# Patient Record
Sex: Male | Born: 1960 | Race: White | Hispanic: No | Marital: Married | Smoking: Never smoker
Health system: Southern US, Community
[De-identification: ages and names within clinical notes are randomized; demographics above are authoritative.]

## PROBLEM LIST (undated history)

## (undated) DIAGNOSIS — J45909 Unspecified asthma, uncomplicated: Secondary | ICD-10-CM

## (undated) DIAGNOSIS — M1990 Unspecified osteoarthritis, unspecified site: Secondary | ICD-10-CM

## (undated) DIAGNOSIS — E119 Type 2 diabetes mellitus without complications: Secondary | ICD-10-CM

## (undated) DIAGNOSIS — I10 Essential (primary) hypertension: Secondary | ICD-10-CM

## (undated) DIAGNOSIS — K219 Gastro-esophageal reflux disease without esophagitis: Secondary | ICD-10-CM

## (undated) DIAGNOSIS — E669 Obesity, unspecified: Secondary | ICD-10-CM

## (undated) HISTORY — DX: Essential (primary) hypertension: I10

## (undated) HISTORY — PX: COLONOSCOPY: SHX174

## (undated) HISTORY — PX: TONSILLECTOMY: SUR1361

## (undated) HISTORY — DX: Obesity, unspecified: E66.9

## (undated) HISTORY — DX: Type 2 diabetes mellitus without complications: E11.9

---

## 2002-06-22 ENCOUNTER — Encounter: Payer: Self-pay | Admitting: Emergency Medicine

## 2002-06-22 ENCOUNTER — Emergency Department (HOSPITAL_COMMUNITY): Admission: EM | Admit: 2002-06-22 | Discharge: 2002-06-22 | Payer: Self-pay | Admitting: Emergency Medicine

## 2008-02-16 ENCOUNTER — Ambulatory Visit: Payer: Self-pay | Admitting: Vascular Surgery

## 2008-02-16 ENCOUNTER — Ambulatory Visit: Admission: RE | Admit: 2008-02-16 | Discharge: 2008-02-16 | Payer: Self-pay | Admitting: Family Medicine

## 2008-02-16 ENCOUNTER — Encounter (INDEPENDENT_AMBULATORY_CARE_PROVIDER_SITE_OTHER): Payer: Self-pay | Admitting: Family Medicine

## 2010-11-01 NOTE — Op Note (Signed)
NAME:  NEO, YEPIZ NO.:  [id number]   MEDICAL RECORD NO.:  [id number]                   PATIENT TYPE:  EMS   LOCATION:  MINO                                 FACILITY:  [facility]   PHYSICIAN:  Leonides Grills, M.D.                  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   POSTOPERATIVE DIAGNOSIS:  Right closed superior extensor retinaculum (SER)  IV lateral malleolus fracture.   OPERATION PERFORMED:  Open reduction internal fixation of right lateral  malleolus fracture.   SURGEON:  Leonides Grills, M.D.   ANESTHESIA:  General endotracheal tube.   ASSISTANT:  Lianne Cure, P.A.   ESTIMATED BLOOD LOSS:  Minimal.   TOURNIQUET TIME:  Approximately half hour.   COMPLICATIONS:  None.   DISPOSITION:  Stable to PR.   INDICATIONS FOR PROCEDURE:  The patient is a 50 year old male who slipped  and fell and sustained the above injury.  She has consented for the above  procedure.  All risks which include infection, neurovascular injury,  nonunion, malunion, hardware irritation, hardware failure, arthritis,  stiffness, were all explained, questions encouraged and answered.   DESCRIPTION OF PROCEDURE:  The patient was brought to the operating room and  placed in supine position.  After adequate general endotracheal anesthesia  was administered as well as Ancef 1 gm IV piggyback, the right lower  extremity was prepped and draped in sterile manner with a proximally placed  thigh tourniquet.  The limb was gravity exsanguinated and the tourniquet was  elevated to 290 mmHg.  A longitudinal incision over the lateral malleolus  was then made down to bone.  The fracture site was identified. Soft tissue  was debrided from the fracture site.  A 4-hole  one-third tubular plate was  then applied in an antiglide manner with a two-point reduction clamp in  inversion, internal rotation, the fracture site was reduced.  Antiglide  screw was then placed.  Proximal screw hole was then placed,  both were with  3.5 mm fully threaded cortical screws.  A lag screw was then placed across  the fracture site and one distal set screw was then placed, both were 3.5 mm  fully threaded cortical screws.  Final x-rays were obtained in the mortise  and lateral views and showed anatomic reduction and maintenance of a the  reduction as  well.  The wound was copiously irrigated with normal saline.  The tourniquet  was deflated, hemostasis was obtained.  Subcutaneous was closed with 2-0  Vicryl, skin was closed with 4-0 nylon.  Sterile dressing was applied.  Jones dressing was applied.  The patient stable to the PR.  Leonides Grills, M.D.    PB/MEDQ  D:  06/28/2002  T:  06/28/2002  Job:  [id number]

## 2012-06-16 HISTORY — PX: EYE SURGERY: SHX253

## 2012-10-29 ENCOUNTER — Other Ambulatory Visit (HOSPITAL_BASED_OUTPATIENT_CLINIC_OR_DEPARTMENT_OTHER): Payer: Self-pay | Admitting: Family Medicine

## 2012-10-29 DIAGNOSIS — IMO0002 Reserved for concepts with insufficient information to code with codable children: Secondary | ICD-10-CM

## 2012-10-30 ENCOUNTER — Ambulatory Visit (HOSPITAL_BASED_OUTPATIENT_CLINIC_OR_DEPARTMENT_OTHER)
Admission: RE | Admit: 2012-10-30 | Discharge: 2012-10-30 | Disposition: A | Discharge status: Home / Self Care | Payer: 59 | Source: Ambulatory Visit | Attending: Family Medicine | Admitting: Family Medicine

## 2012-10-30 DIAGNOSIS — M79609 Pain in unspecified limb: Secondary | ICD-10-CM | POA: Insufficient documentation

## 2012-10-30 DIAGNOSIS — M5126 Other intervertebral disc displacement, lumbar region: Secondary | ICD-10-CM | POA: Insufficient documentation

## 2012-10-30 DIAGNOSIS — M545 Low back pain, unspecified: Secondary | ICD-10-CM | POA: Insufficient documentation

## 2012-10-30 DIAGNOSIS — M5137 Other intervertebral disc degeneration, lumbosacral region: Secondary | ICD-10-CM | POA: Insufficient documentation

## 2012-10-30 DIAGNOSIS — Q762 Congenital spondylolisthesis: Secondary | ICD-10-CM | POA: Insufficient documentation

## 2012-10-30 DIAGNOSIS — M47817 Spondylosis without myelopathy or radiculopathy, lumbosacral region: Secondary | ICD-10-CM | POA: Insufficient documentation

## 2012-10-30 DIAGNOSIS — IMO0002 Reserved for concepts with insufficient information to code with codable children: Secondary | ICD-10-CM

## 2012-10-30 DIAGNOSIS — R29898 Other symptoms and signs involving the musculoskeletal system: Secondary | ICD-10-CM | POA: Insufficient documentation

## 2012-10-30 DIAGNOSIS — M51379 Other intervertebral disc degeneration, lumbosacral region without mention of lumbar back pain or lower extremity pain: Secondary | ICD-10-CM | POA: Insufficient documentation

## 2012-10-30 DIAGNOSIS — M48061 Spinal stenosis, lumbar region without neurogenic claudication: Secondary | ICD-10-CM | POA: Insufficient documentation

## 2014-02-09 ENCOUNTER — Encounter: Payer: 59 | Attending: Family Medicine

## 2014-02-09 VITALS — Ht 68.0 in | Wt 278.2 lb

## 2014-02-09 DIAGNOSIS — Z713 Dietary counseling and surveillance: Secondary | ICD-10-CM | POA: Insufficient documentation

## 2014-02-09 DIAGNOSIS — E119 Type 2 diabetes mellitus without complications: Secondary | ICD-10-CM | POA: Diagnosis present

## 2014-02-09 NOTE — Progress Notes (Signed)
Patient was seen on 02/09/2014 for the first of a series of three diabetes self-management courses at the [facility].  Patient Education Plan per assessed needs and concerns is to attend four course education program for Diabetes Self Management Education.  Current HbA1c: 9.2%  The following learning objectives were met by the patient during this class:  Describe diabetes  State some common risk factors for diabetes  Defines the role of glucose and insulin  Identifies type of diabetes and pathophysiology  Describe the relationship between diabetes and cardiovascular risk  State the members of the Healthcare Team  States the rationale for glucose monitoring  State when to test glucose  State their individual Target Range  State the importance of logging glucose readings  Describe how to interpret glucose readings  Identifies A1C target  Explain the correlation between A1c and eAG values  State symptoms and treatment of high blood glucose  State symptoms and treatment of low blood glucose  Explain proper technique for glucose testing  Identifies proper sharps disposal  Handouts given during class include:  Living Well with Diabetes book  Carb Counting and Meal Planning book  Meal Plan Card  Carbohydrate guide  Meal planning worksheet  Low Sodium Flavoring Tips  The diabetes portion plate  K2X to eAG Conversion Chart  Diabetes Medications  Diabetes Recommended Care Schedule  Support Group  Diabetes Success Plan  Core Class Satisfaction Survey  Follow-Up Plan:  Attend core 2

## 2014-02-16 ENCOUNTER — Encounter: Payer: 59 | Attending: Family Medicine

## 2014-02-16 DIAGNOSIS — E119 Type 2 diabetes mellitus without complications: Secondary | ICD-10-CM | POA: Diagnosis present

## 2014-02-16 DIAGNOSIS — Z713 Dietary counseling and surveillance: Secondary | ICD-10-CM | POA: Insufficient documentation

## 2014-02-17 NOTE — Progress Notes (Signed)

## 2014-02-23 DIAGNOSIS — E119 Type 2 diabetes mellitus without complications: Secondary | ICD-10-CM | POA: Diagnosis not present

## 2014-02-28 NOTE — Progress Notes (Signed)
Correction to previously noted patient goals:  Patient was seen on 02/23/14 for the third of a series of three diabetes self-management courses at the [facility]. The following learning objectives were met by the patient during this class:    State the amount of activity recommended for healthy living   Describe activities suitable for individual needs   Identify ways to regularly incorporate activity into daily life   Identify barriers to activity and ways to over come these barriers  Identify diabetes medications being personally used and their primary action for lowering glucose and possible side effects   Describe role of stress on blood glucose and develop strategies to address psychosocial issues   Identify diabetes complications and ways to prevent them  Explain how to manage diabetes during illness   Evaluate success in meeting personal goal   Establish 2-3 goals that they will plan to diligently work on until they return for the  25-monthfollow-up visit  Goals:  Follow Diabetes Meal Plan as instructed  Aim for 15-30 mins of physical activity daily as tolerated  Bring food record and glucose log to your follow up visit  Your patient has established the following 4 month goals in their individualized success plan: I will count my carb choices at most meals and snacks Watch Sodium I will be active 30 minutes or more 5 times a week I will take my diabetes medications as scheduled I will eat less unhealthy fats by eating less red meat I will test my glucose at least 2 times a day, 7 days a week I will look at patterns in my record book at least 4 days a month To help manage stress I will  exercise at least 5 times a week  Your patient has identified these potential barriers to change:  Stress  Your patient has identified their diabetes self-care support plan as  [facility]  On-Line resources  Plan:  Attend Core 4 in 4 months

## 2014-02-28 NOTE — Progress Notes (Signed)
Patient was seen on 02/23/14 for the third of a series of three diabetes self-management courses at the [facility]. The following learning objectives were met by the patient during this class:    State the amount of activity recommended for healthy living   Describe activities suitable for individual needs   Identify ways to regularly incorporate activity into daily life   Identify barriers to activity and ways to over come these barriers  Identify diabetes medications being personally used and their primary action for lowering glucose and possible side effects   Describe role of stress on blood glucose and develop strategies to address psychosocial issues   Identify diabetes complications and ways to prevent them  Explain how to manage diabetes during illness   Evaluate success in meeting personal goal   Establish 2-3 goals that they will plan to diligently work on until they return for the  72-monthfollow-up visit  Goals:  Follow Diabetes Meal Plan as instructed  Aim for 15-30 mins of physical activity daily as tolerated  Bring food record and glucose log to your follow up visit  Your patient has established the following 4 month goals in their individualized success plan: I will count my carb choices at most meals and snacks Need to not binge I will be active 15 minutes or more 3 times a week I will take my diabetes medications as scheduled To help manage stress I will  Plan my meals at least 7 times a week  Your patient has identified these potential barriers to change:  stress  Your patient has identified their diabetes self-care support plan as  [facility]  Family support  On-Line resources  Plan:  Attend Core 4 in 4 months

## 2014-06-19 ENCOUNTER — Encounter: Payer: 59 | Attending: Family Medicine

## 2014-06-19 DIAGNOSIS — E119 Type 2 diabetes mellitus without complications: Secondary | ICD-10-CM | POA: Insufficient documentation

## 2014-06-19 DIAGNOSIS — Z713 Dietary counseling and surveillance: Secondary | ICD-10-CM | POA: Insufficient documentation

## 2014-06-20 NOTE — Progress Notes (Signed)
Appt start time: 1730 end time:  1830.  Patient was seen on 06/19/2013 for a review of the series of three diabetes self-management courses at the [facility]. The following learning objectives were met by the patient during this class:  . Reviewed blood glucose monitoring and interpretation including the recommended target ranges and Hgb A1c.  . Reviewed on carb counting, importance of regularly scheduled meals/snacks, and meal planning.  . Reviewed the effects of physical activity on glucose levels and long-term glucose control.  Recommended goal of 150 minutes of physical activity/week. . Reviewed patient medications and discussed role of medication on blood glucose and possible side effects. . Discussed strategies to manage stress, psychosocial issues, and other obstacles to diabetes management. . Encouraged moderate weight reduction to improve glucose levels.   . Reviewed short-term complications: hyper- and hypo-glycemia.  Discussed causes, symptoms, and treatment options. . Reviewed prevention, detection, and treatment of long-term complications.  Discussed the role of prolonged elevated glucose levels on body systems.  Goals:  Follow Diabetes Meal Plan as instructed  Eat 3 meals and 2 snacks, every 3-5 hrs  Limit carbohydrate intake to 45 grams carbohydrate/meal Limit carbohydrate intake to 15 grams carbohydrate/snack Add lean protein foods to meals/snacks  Monitor glucose levels as instructed by your doctor  Aim for goal of 15-30 mins of physical activity daily as tolerated  Bring food record and glucose log to your next nutrition visit

## 2016-06-23 DIAGNOSIS — M75101 Unspecified rotator cuff tear or rupture of right shoulder, not specified as traumatic: Secondary | ICD-10-CM | POA: Diagnosis not present

## 2016-06-30 DIAGNOSIS — E119 Type 2 diabetes mellitus without complications: Secondary | ICD-10-CM | POA: Diagnosis not present

## 2016-07-01 DIAGNOSIS — M25511 Pain in right shoulder: Secondary | ICD-10-CM | POA: Diagnosis not present

## 2016-10-28 DIAGNOSIS — E782 Mixed hyperlipidemia: Secondary | ICD-10-CM | POA: Diagnosis not present

## 2016-10-28 DIAGNOSIS — E119 Type 2 diabetes mellitus without complications: Secondary | ICD-10-CM | POA: Diagnosis not present

## 2016-10-28 DIAGNOSIS — I10 Essential (primary) hypertension: Secondary | ICD-10-CM | POA: Diagnosis not present

## 2016-12-30 DIAGNOSIS — M545 Low back pain: Secondary | ICD-10-CM | POA: Diagnosis not present

## 2016-12-30 DIAGNOSIS — L723 Sebaceous cyst: Secondary | ICD-10-CM | POA: Diagnosis not present

## 2016-12-30 DIAGNOSIS — R21 Rash and other nonspecific skin eruption: Secondary | ICD-10-CM | POA: Diagnosis not present

## 2017-01-09 ENCOUNTER — Ambulatory Visit (HOSPITAL_BASED_OUTPATIENT_CLINIC_OR_DEPARTMENT_OTHER)
Admission: RE | Admit: 2017-01-09 | Discharge: 2017-01-09 | Disposition: A | Discharge status: Home / Self Care | Payer: 59 | Source: Ambulatory Visit | Attending: Family Medicine | Admitting: Family Medicine

## 2017-01-09 ENCOUNTER — Other Ambulatory Visit: Payer: Self-pay | Admitting: Family Medicine

## 2017-01-09 DIAGNOSIS — M1611 Unilateral primary osteoarthritis, right hip: Secondary | ICD-10-CM | POA: Diagnosis not present

## 2017-01-09 DIAGNOSIS — M545 Low back pain: Secondary | ICD-10-CM

## 2017-01-09 DIAGNOSIS — M4316 Spondylolisthesis, lumbar region: Secondary | ICD-10-CM | POA: Insufficient documentation

## 2017-01-09 DIAGNOSIS — M25551 Pain in right hip: Secondary | ICD-10-CM

## 2017-01-14 ENCOUNTER — Other Ambulatory Visit: Payer: Self-pay | Admitting: Family Medicine

## 2017-01-14 DIAGNOSIS — M25551 Pain in right hip: Secondary | ICD-10-CM

## 2017-01-17 ENCOUNTER — Ambulatory Visit (HOSPITAL_BASED_OUTPATIENT_CLINIC_OR_DEPARTMENT_OTHER)
Admission: RE | Admit: 2017-01-17 | Discharge: 2017-01-17 | Disposition: A | Discharge status: Home / Self Care | Payer: 59 | Source: Ambulatory Visit | Attending: Family Medicine | Admitting: Family Medicine

## 2017-01-17 DIAGNOSIS — M1611 Unilateral primary osteoarthritis, right hip: Secondary | ICD-10-CM | POA: Insufficient documentation

## 2017-01-17 DIAGNOSIS — M25551 Pain in right hip: Secondary | ICD-10-CM | POA: Insufficient documentation

## 2017-01-20 DIAGNOSIS — M1611 Unilateral primary osteoarthritis, right hip: Secondary | ICD-10-CM | POA: Diagnosis not present

## 2017-01-20 DIAGNOSIS — M5136 Other intervertebral disc degeneration, lumbar region: Secondary | ICD-10-CM | POA: Diagnosis not present

## 2017-01-22 ENCOUNTER — Other Ambulatory Visit: Payer: Self-pay | Admitting: Sports Medicine

## 2017-01-22 DIAGNOSIS — M1611 Unilateral primary osteoarthritis, right hip: Secondary | ICD-10-CM

## 2017-01-27 DIAGNOSIS — Z125 Encounter for screening for malignant neoplasm of prostate: Secondary | ICD-10-CM | POA: Diagnosis not present

## 2017-02-03 ENCOUNTER — Ambulatory Visit
Admission: RE | Admit: 2017-02-03 | Discharge: 2017-02-03 | Disposition: A | Discharge status: Home / Self Care | Payer: 59 | Source: Ambulatory Visit | Attending: Sports Medicine | Admitting: Sports Medicine

## 2017-02-03 DIAGNOSIS — M1611 Unilateral primary osteoarthritis, right hip: Secondary | ICD-10-CM | POA: Diagnosis not present

## 2017-02-03 MED ORDER — IOPAMIDOL (ISOVUE-M 200) INJECTION 41%
1.0000 mL | Freq: Once | INTRAMUSCULAR | Status: AC
  Administered 2017-02-03: 1 mL via INTRA_ARTICULAR

## 2017-02-03 MED ORDER — METHYLPREDNISOLONE ACETATE 40 MG/ML INJ SUSP (RADIOLOG
120.0000 mg | Freq: Once | INTRAMUSCULAR | Status: AC
  Administered 2017-02-03: 120 mg via INTRA_ARTICULAR

## 2017-03-19 DIAGNOSIS — Z23 Encounter for immunization: Secondary | ICD-10-CM | POA: Diagnosis not present

## 2017-03-19 DIAGNOSIS — M25551 Pain in right hip: Secondary | ICD-10-CM | POA: Diagnosis not present

## 2017-03-19 DIAGNOSIS — M1611 Unilateral primary osteoarthritis, right hip: Secondary | ICD-10-CM | POA: Diagnosis not present

## 2017-03-24 DIAGNOSIS — M1611 Unilateral primary osteoarthritis, right hip: Secondary | ICD-10-CM | POA: Diagnosis not present

## 2017-03-30 DIAGNOSIS — M1611 Unilateral primary osteoarthritis, right hip: Secondary | ICD-10-CM | POA: Diagnosis not present

## 2017-05-12 DIAGNOSIS — E782 Mixed hyperlipidemia: Secondary | ICD-10-CM | POA: Diagnosis not present

## 2017-05-12 DIAGNOSIS — I10 Essential (primary) hypertension: Secondary | ICD-10-CM | POA: Diagnosis not present

## 2017-05-12 DIAGNOSIS — E119 Type 2 diabetes mellitus without complications: Secondary | ICD-10-CM | POA: Diagnosis not present

## 2017-07-06 DIAGNOSIS — E119 Type 2 diabetes mellitus without complications: Secondary | ICD-10-CM | POA: Diagnosis not present

## 2017-08-14 DIAGNOSIS — I10 Essential (primary) hypertension: Secondary | ICD-10-CM | POA: Diagnosis not present

## 2017-08-19 DIAGNOSIS — M1611 Unilateral primary osteoarthritis, right hip: Secondary | ICD-10-CM | POA: Diagnosis not present

## 2017-08-20 DIAGNOSIS — M25562 Pain in left knee: Secondary | ICD-10-CM | POA: Diagnosis not present

## 2017-08-20 DIAGNOSIS — M25561 Pain in right knee: Secondary | ICD-10-CM | POA: Diagnosis not present

## 2017-08-20 DIAGNOSIS — M170 Bilateral primary osteoarthritis of knee: Secondary | ICD-10-CM | POA: Diagnosis not present

## 2017-08-21 DIAGNOSIS — E119 Type 2 diabetes mellitus without complications: Secondary | ICD-10-CM | POA: Diagnosis not present

## 2017-08-21 DIAGNOSIS — M545 Low back pain: Secondary | ICD-10-CM | POA: Diagnosis not present

## 2017-08-21 DIAGNOSIS — G8929 Other chronic pain: Secondary | ICD-10-CM | POA: Diagnosis not present

## 2017-08-25 DIAGNOSIS — M170 Bilateral primary osteoarthritis of knee: Secondary | ICD-10-CM | POA: Diagnosis not present

## 2017-08-25 DIAGNOSIS — M25561 Pain in right knee: Secondary | ICD-10-CM | POA: Diagnosis not present

## 2017-08-25 DIAGNOSIS — M25562 Pain in left knee: Secondary | ICD-10-CM | POA: Diagnosis not present

## 2017-08-28 DIAGNOSIS — E119 Type 2 diabetes mellitus without complications: Secondary | ICD-10-CM | POA: Diagnosis not present

## 2017-08-28 DIAGNOSIS — Z01818 Encounter for other preprocedural examination: Secondary | ICD-10-CM | POA: Diagnosis not present

## 2017-08-28 DIAGNOSIS — Z23 Encounter for immunization: Secondary | ICD-10-CM | POA: Diagnosis not present

## 2017-09-01 DIAGNOSIS — M25562 Pain in left knee: Secondary | ICD-10-CM | POA: Diagnosis not present

## 2017-09-01 DIAGNOSIS — M25561 Pain in right knee: Secondary | ICD-10-CM | POA: Diagnosis not present

## 2017-09-01 DIAGNOSIS — M170 Bilateral primary osteoarthritis of knee: Secondary | ICD-10-CM | POA: Diagnosis not present

## 2017-09-04 DIAGNOSIS — M1611 Unilateral primary osteoarthritis, right hip: Secondary | ICD-10-CM | POA: Diagnosis not present

## 2017-09-16 DIAGNOSIS — M1611 Unilateral primary osteoarthritis, right hip: Secondary | ICD-10-CM | POA: Diagnosis present

## 2017-09-16 DIAGNOSIS — E119 Type 2 diabetes mellitus without complications: Secondary | ICD-10-CM

## 2017-09-16 NOTE — H&P (Addendum)
HIP ARTHROPLASTY ADMISSION H&P  Patient ID: Jonathon Doyle MRN: [id number] DOB/AGE: 09-27-1960 57 y.o.  Chief Complaint: right hip pain.  Planned Procedure Date: 10/06/2017 Medical and cardiac Clearance by Terri Piedraourtney Forcucci PA-C  HPI: Jonathon Doyle is a 57 y.o. male with a history of diabetes and lumbar disease who presents for evaluation of OA RIGHT HIP. The patient has a history of pain and functional disability in the right hip due to arthritis and has failed non-surgical conservative treatments for greater than 12 weeks to include NSAID's and/or analgesics, weight reduction as appropriate and activity modification.  Onset of symptoms was gradual, starting 2 +years ago with gradually worsening course since that time.  Patient currently rates pain at 9 out of 10 with activity. Patient has night pain, worsening of pain with activity and weight bearing and pain that interferes with activities of daily living.  Patient has evidence of subchondral cysts, subchondral sclerosis, periarticular osteophytes and joint space narrowing by imaging studies.  There is no active infection.  Past Medical History:  Diagnosis Date  . Diabetes mellitus without complication   . Hypertension   . Obesity   . 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  Non-smoker.  Occasional snuff use.  2-3 beers on weekends.  Family History: Mother with HTN, cancer.  Grandparents with diabetes.  ROS: Currently denies lightheadedness, dizziness, Fever, chills, CP, SOB.   No personal history of DVT, PE, MI, or  CVA. No loose teeth or dentures All other systems have been reviewed and were otherwise currently negative with the exception of those mentioned in the HPI and as above.  Objective: Vitals: Ht: 5 feet 7 inches wt: 260 temp: 97.7 BP: 123/75 pulse: 88 O2 97 % on room air.   Physical Exam: General: Alert, NAD. Trendelenberg Gait  HEENT: EOMI, Good Neck Extension  Pulm: No increased work of breathing.  Clear B/L A/P w/o crackle or wheeze.  CV: RRR, No m/g/r appreciated  GI: soft, NT, ND Neuro: Neuro without gross focal deficit.  Sensation intact distally Skin: No lesions in the area of chief complaint MSK/Surgical Site: Right hip pain with passive ROM.  Positive Stinchfield.  5/5 strength.  NVI.  Sensation intact distally.  Imaging Review Plain radiographs demonstrate severe degenerative joint disease of the right hip.   Preoperative templating of the joint replacement has been completed, documented, and submitted to the Operating Room personnel in order to optimize intra-operative equipment management.  Assessment: OA RIGHT HIP Principal Problem:   Primary osteoarthritis of right hip Active Problems:   Diabetes mellitus without complication (HCC)   Plan: Plan for Procedure(s): TOTAL HIP ARTHROPLASTY ANTERIOR APPROACH  The patient history, physical exam, clinical judgement of the provider and imaging are consistent with end stage degenerative joint disease and total joint arthroplasty is deemed medically necessary. The treatment options including medical management, injection therapy, and arthroplasty were discussed at length. The risks and benefits of Procedure(s): TOTAL HIP ARTHROPLASTY ANTERIOR APPROACH were presented and reviewed.  The risks of nonoperative treatment, versus surgical intervention including but not limited to continued pain, aseptic loosening, stiffness, dislocation/subluxation, infection, bleeding, nerve  injury, blood clots, cardiopulmonary complications,  morbidity, mortality, among others were discussed. The patient verbalizes understanding and wishes to proceed with the plan.  Patient is being admitted for inpatient treatment for surgery, pain control, PT, OT, prophylactic antibiotics, VTE prophylaxis, progressive ambulation, ADL's and discharge planning.   Dental prophylaxis discussed and recommended for 2 years postoperatively.   The patient does meet the criteria for TXA which will be used perioperatively.    ASA 325 mg will be used postoperatively for DVT prophylaxis in addition to SCDs, and early ambulation.  The patient is planning to be discharged home with home health services ([facility]) in care of his wife, in-laws, and friends.  Monitor Glucose while inpatient dt Diabetes.  Plan to continue Diclofenac for 2 weeks post op after d/c.  Will add omeprazole for gastroprotection when taking this and ASA.   Lucretia Kern Martensen III, PA-C 09/16/2017 1:40 PM

## 2017-09-24 NOTE — Pre-Procedure Instructions (Signed)
Jonathon Doyle  09/24/2017      [id number] - [place], [place] - [address] [place]  [id number] Phone: [phone] Fax: [phone]    Your procedure is scheduled on October 06, 2017.  Report to [facility] Admitting at 530 AM.  Call this number if you have problems the morning of surgery:  [phone]   Remember:  Do not eat food or drink liquids after midnight.  Take these medicines the morning of surgery with A SIP OF WATER  Loratadine (claritin) cyclobenzaprine (flexeril)-if  Needed Tramadol (ultram)  7 days prior to surgery STOP taking any diclofenac (voltaren), Aspirin (unless otherwise instructed by your surgeon), Aleve, Naproxen, Ibuprofen, Motrin, Advil, Goody's, BC's, all herbal medications, fish oil, and all vitamins  Continue all other medications as instructed by your physician except follow the above medication instructions before surgery   WHAT DO I DO ABOUT MY DIABETES MEDICATION?  Marland Kitchen Do not take oral diabetes medicines (pills) the morning of surgery pioglitazone (actos) or metformin (glucophage).  Reviewed and Endorsed by [facility] Patient Education Committee, August 2015   How to Manage Your Diabetes Before and After Surgery  Why is it important to control my blood sugar before and after surgery? . Improving blood sugar levels before and after surgery helps healing and can limit problems. . A way of improving blood sugar control is eating a healthy diet by: o  Eating less sugar and carbohydrates o  Increasing activity/exercise o  Talking with your doctor about reaching your blood sugar goals . High blood sugars (greater than 180 mg/dL) can raise your risk of infections and slow your recovery, so you will need to focus on controlling your diabetes during the weeks before surgery. . Make sure that the doctor who takes care of your diabetes knows about your planned surgery including the date  and location.  How do I manage my blood sugar before surgery? . Check your blood sugar at least 4 times a day, starting 2 days before surgery, to make sure that the level is not too high or low. o Check your blood sugar the morning of your surgery when you wake up and every 2 hours until you get to the Short Stay unit. . If your blood sugar is less than 70 mg/dL, you will need to treat for low blood sugar: o Do not take insulin. o Treat a low blood sugar (less than 70 mg/dL) with  cup of clear juice (cranberry or apple), 4 glucose tablets, OR glucose gel. Recheck blood sugar in 15 minutes after treatment (to make sure it is greater than 70 mg/dL). If your blood sugar is not greater than 70 mg/dL on recheck, call [phone] o  for further instructions. . Report your blood sugar to the short stay nurse when you get to Short Stay.  . If you are admitted to the hospital after surgery: o Your blood sugar will be checked by the staff and you will probably be given insulin after surgery (instead of oral diabetes medicines) to make sure you have good blood sugar levels. o The goal for blood sugar control after surgery is 80-180 mg/dL.   Contacts, dentures or bridgework may not be worn into surgery.  Leave your suitcase in the car.  After surgery it may be brought to your room.  For patients admitted to the hospital, discharge time will be determined by your treatment team.  Patients  discharged the day of surgery will not be allowed to drive home.    [facility]- Preparing For Surgery  Before surgery, you can play an important role. Because skin is not sterile, your skin needs to be as free of germs as possible. You can reduce the number of germs on your skin by washing with CHG (chlorahexidine gluconate) Soap before surgery.  CHG is an antiseptic cleaner which kills germs and bonds with the skin to continue killing germs even after washing.  Please do not use if you have an allergy to CHG or  antibacterial soaps. If your skin becomes reddened/irritated stop using the CHG.  Do not shave (including legs and underarms) for at least 48 hours prior to first CHG shower. It is OK to shave your face.  Please follow these instructions carefully.   1. Shower the NIGHT BEFORE SURGERY and the MORNING OF SURGERY with CHG.   2. If you chose to wash your hair, wash your hair first as usual with your normal shampoo.  3. After you shampoo, rinse your hair and body thoroughly to remove the shampoo.  4. Use CHG as you would any other liquid soap. You can apply CHG directly to the skin and wash gently with a scrungie or a clean washcloth.   5. Apply the CHG Soap to your body ONLY FROM THE NECK DOWN.  Do not use on open wounds or open sores. Avoid contact with your eyes, ears, mouth and genitals (private parts). Wash Face and genitals (private parts)  with your normal soap.  6. Wash thoroughly, paying special attention to the area where your surgery will be performed.  7. Thoroughly rinse your body with warm water from the neck down.  8. DO NOT shower/wash with your normal soap after using and rinsing off the CHG Soap.  9. Pat yourself dry with a CLEAN TOWEL.  10. Wear CLEAN PAJAMAS to bed the night before surgery, wear comfortable clothes the morning of surgery  11. Place CLEAN SHEETS on your bed the night of your first shower and DO NOT SLEEP WITH PETS.  Day of Surgery: Do not apply any deodorants/lotions. Please wear clean clothes to the hospital/surgery center.                 Do not wear jewelry, make-up or nail polish.  Do not wear lotions, powders, or perfumes, or deodorant.  Do not shave 48 hours prior to surgery.  Men may shave face and neck.  Do not bring valuables to the hospital.  [facility] is not [facility] for any belongings or valuables.  Please read over the following fact sheets that you were given. Pain Booklet, Coughing and Deep Breathing, MRSA Information and  Surgical Site Infection Prevention

## 2017-09-24 NOTE — Progress Notes (Addendum)
PCP: Aron Babaourtney Forcuucci PA-C  Cardiologist: pt denies  [facility]  Stress test: pt denies ever  ECHO: pt denies ever  Cardiac Cath: pt denies ever  Chest x-ray: pt denies past year

## 2017-09-25 ENCOUNTER — Other Ambulatory Visit: Payer: Self-pay

## 2017-09-25 ENCOUNTER — Encounter (HOSPITAL_COMMUNITY): Payer: Self-pay

## 2017-09-25 ENCOUNTER — Encounter (HOSPITAL_COMMUNITY)
Admission: RE | Admit: 2017-09-25 | Discharge: 2017-09-25 | Disposition: A | Discharge status: Home / Self Care | Payer: 59 | Source: Ambulatory Visit | Attending: Orthopedic Surgery | Admitting: Orthopedic Surgery

## 2017-09-25 DIAGNOSIS — Z01812 Encounter for preprocedural laboratory examination: Secondary | ICD-10-CM | POA: Insufficient documentation

## 2017-09-25 DIAGNOSIS — M1611 Unilateral primary osteoarthritis, right hip: Secondary | ICD-10-CM | POA: Insufficient documentation

## 2017-09-25 LAB — CBC
HCT: 40.9 % (ref 39.0–52.0)
HEMOGLOBIN: 13.9 g/dL (ref 13.0–17.0)
MCH: 29.3 pg (ref 26.0–34.0)
MCHC: 34 g/dL (ref 30.0–36.0)
MCV: 86.1 fL (ref 78.0–100.0)
Platelets: 241 10*3/uL (ref 150–400)
RBC: 4.75 MIL/uL (ref 4.22–5.81)
RDW: 14 % (ref 11.5–15.5)
WBC: 7 10*3/uL (ref 4.0–10.5)

## 2017-09-25 LAB — HEMOGLOBIN A1C
Hgb A1c MFr Bld: 7.1 % — ABNORMAL HIGH (ref 4.8–5.6)
MEAN PLASMA GLUCOSE: 157.07 mg/dL

## 2017-09-25 LAB — BASIC METABOLIC PANEL
ANION GAP: 12 (ref 5–15)
BUN: 15 mg/dL (ref 6–20)
CO2: 22 mmol/L (ref 22–32)
Calcium: 9.7 mg/dL (ref 8.9–10.3)
Chloride: 103 mmol/L (ref 101–111)
Creatinine, Ser: 0.86 mg/dL (ref 0.61–1.24)
GFR calc Af Amer: >60 mL/min (ref >60)
GFR calc non Af Amer: >60 mL/min (ref >60)
GLUCOSE: 154 mg/dL — AB (ref 65–99)
POTASSIUM: 4.3 mmol/L (ref 3.5–5.1)
Sodium: 137 mmol/L (ref 135–145)

## 2017-09-25 LAB — GLUCOSE, CAPILLARY: Glucose-Capillary: 138 mg/dL — ABNORMAL HIGH (ref 65–99)

## 2017-09-25 LAB — SURGICAL PCR SCREEN
MRSA, PCR: NEGATIVE
Staphylococcus aureus: NEGATIVE

## 2017-09-30 DIAGNOSIS — M1611 Unilateral primary osteoarthritis, right hip: Secondary | ICD-10-CM | POA: Diagnosis not present

## 2017-10-05 MED ORDER — CEFAZOLIN SODIUM-DEXTROSE 2-4 GM/100ML-% IV SOLN
2.0000 g | INTRAVENOUS | Status: AC
  Administered 2017-10-06: 2 g via INTRAVENOUS
  Filled 2017-10-05: qty 100

## 2017-10-05 MED ORDER — GABAPENTIN 300 MG PO CAPS
300.0000 mg | ORAL_CAPSULE | Freq: Once | ORAL | Status: AC
  Administered 2017-10-06: 300 mg via ORAL
  Filled 2017-10-05: qty 1

## 2017-10-05 MED ORDER — TRANEXAMIC ACID 1000 MG/10ML IV SOLN
2000.0000 mg | Freq: Once | INTRAVENOUS | Status: AC
  Administered 2017-10-06: 2000 mg via TOPICAL
  Filled 2017-10-05: qty 20

## 2017-10-05 MED ORDER — LACTATED RINGERS IV SOLN
INTRAVENOUS | Status: DC
  Administered 2017-10-06: 07:00:00 via INTRAVENOUS

## 2017-10-05 MED ORDER — BUPIVACAINE LIPOSOME 1.3 % IJ SUSP
20.0000 mL | Freq: Once | INTRAMUSCULAR | Status: DC
  Filled 2017-10-05: qty 20

## 2017-10-05 MED ORDER — TRANEXAMIC ACID 1000 MG/10ML IV SOLN
1000.0000 mg | INTRAVENOUS | Status: AC
  Administered 2017-10-06: 1000 mg via INTRAVENOUS
  Filled 2017-10-05: qty 1100

## 2017-10-05 MED ORDER — ACETAMINOPHEN 500 MG PO TABS
1000.0000 mg | ORAL_TABLET | Freq: Once | ORAL | Status: AC
  Administered 2017-10-06: 1000 mg via ORAL
  Filled 2017-10-05: qty 2

## 2017-10-05 NOTE — Anesthesia Preprocedure Evaluation (Addendum)
Anesthesia Evaluation  Patient identified by MRN, date of birth, ID band Patient awake    Reviewed: Allergy & Precautions, NPO status , Patient's Chart, lab work & pertinent test results  Airway Mallampati: IV  TM Distance: >3 FB Neck ROM: Full    Dental  (+) Dental Advisory Given, Teeth Intact   Pulmonary neg pulmonary ROS,    breath sounds clear to auscultation       Cardiovascular hypertension, Pt. on medications  Rhythm:Regular Rate:Normal     Neuro/Psych negative neurological ROS  negative psych ROS   GI/Hepatic negative GI ROS, Neg liver ROS,   Endo/Other  diabetes, Type 2, Oral Hypoglycemic AgentsMorbid obesity  Renal/GU negative Renal ROS  negative genitourinary   Musculoskeletal  (+) Arthritis , Osteoarthritis,    Abdominal (+) + obese,   Peds  Hematology negative hematology ROS (+)   Anesthesia Other Findings   Reproductive/Obstetrics                            Anesthesia Physical Anesthesia Plan  ASA: III  Anesthesia Plan: Spinal   Post-op Pain Management:    Induction:   PONV Risk Score and Plan: Treatment may vary due to age or medical condition and Propofol infusion  Airway Management Planned: Natural Airway and Simple Face Mask  Additional Equipment: None  Intra-op Plan:   Post-operative Plan:   Informed Consent: I have reviewed the patients History and Physical, chart, labs and discussed the procedure including the risks, benefits and alternatives for the proposed anesthesia with the patient or authorized representative who has indicated his/her understanding and acceptance.     Plan Discussed with: CRNA and Anesthesiologist  Anesthesia Plan Comments:         Anesthesia Quick Evaluation

## 2017-10-06 ENCOUNTER — Inpatient Hospital Stay (HOSPITAL_COMMUNITY): Payer: 59

## 2017-10-06 ENCOUNTER — Inpatient Hospital Stay (HOSPITAL_COMMUNITY): Payer: 59 | Admitting: Emergency Medicine

## 2017-10-06 ENCOUNTER — Other Ambulatory Visit: Payer: Self-pay

## 2017-10-06 ENCOUNTER — Inpatient Hospital Stay (HOSPITAL_COMMUNITY)
Admission: RE | Admit: 2017-10-06 | Discharge: 2017-10-07 | DRG: 470 | Disposition: A | Discharge status: Home Health | Payer: 59 | Source: Ambulatory Visit | Attending: Orthopedic Surgery | Admitting: Orthopedic Surgery

## 2017-10-06 ENCOUNTER — Inpatient Hospital Stay (HOSPITAL_COMMUNITY): Payer: 59 | Admitting: Certified Registered"

## 2017-10-06 ENCOUNTER — Encounter (HOSPITAL_COMMUNITY): Payer: Self-pay | Admitting: *Deleted

## 2017-10-06 ENCOUNTER — Encounter (HOSPITAL_COMMUNITY)
Admission: RE | Disposition: A | Discharge status: Home Health | Payer: Self-pay | Source: Ambulatory Visit | Attending: Orthopedic Surgery

## 2017-10-06 DIAGNOSIS — Z79899 Other long term (current) drug therapy: Secondary | ICD-10-CM

## 2017-10-06 DIAGNOSIS — G4730 Sleep apnea, unspecified: Secondary | ICD-10-CM | POA: Diagnosis present

## 2017-10-06 DIAGNOSIS — Z419 Encounter for procedure for purposes other than remedying health state, unspecified: Secondary | ICD-10-CM

## 2017-10-06 DIAGNOSIS — Z471 Aftercare following joint replacement surgery: Secondary | ICD-10-CM | POA: Diagnosis not present

## 2017-10-06 DIAGNOSIS — I10 Essential (primary) hypertension: Secondary | ICD-10-CM | POA: Diagnosis present

## 2017-10-06 DIAGNOSIS — Z6841 Body Mass Index (BMI) 40.0 and over, adult: Secondary | ICD-10-CM | POA: Diagnosis not present

## 2017-10-06 DIAGNOSIS — M1611 Unilateral primary osteoarthritis, right hip: Secondary | ICD-10-CM | POA: Diagnosis not present

## 2017-10-06 DIAGNOSIS — M1610 Unilateral primary osteoarthritis, unspecified hip: Secondary | ICD-10-CM | POA: Diagnosis present

## 2017-10-06 DIAGNOSIS — E119 Type 2 diabetes mellitus without complications: Secondary | ICD-10-CM | POA: Diagnosis present

## 2017-10-06 DIAGNOSIS — Z7984 Long term (current) use of oral hypoglycemic drugs: Secondary | ICD-10-CM

## 2017-10-06 DIAGNOSIS — E669 Obesity, unspecified: Secondary | ICD-10-CM | POA: Diagnosis present

## 2017-10-06 DIAGNOSIS — M25551 Pain in right hip: Secondary | ICD-10-CM

## 2017-10-06 DIAGNOSIS — Z96641 Presence of right artificial hip joint: Secondary | ICD-10-CM | POA: Diagnosis not present

## 2017-10-06 HISTORY — PX: TOTAL HIP ARTHROPLASTY: SHX124

## 2017-10-06 LAB — GLUCOSE, CAPILLARY
GLUCOSE-CAPILLARY: 224 mg/dL — AB (ref 65–99)
Glucose-Capillary: 151 mg/dL — ABNORMAL HIGH (ref 65–99)
Glucose-Capillary: 152 mg/dL — ABNORMAL HIGH (ref 65–99)
Glucose-Capillary: 114 mg/dL — ABNORMAL HIGH (ref 65–99)
Glucose-Capillary: 211 mg/dL — ABNORMAL HIGH (ref 65–99)
Glucose-Capillary: 91 mg/dL (ref 65–99)

## 2017-10-06 SURGERY — ARTHROPLASTY, HIP, TOTAL, ANTERIOR APPROACH
Anesthesia: Spinal | Site: Hip | Laterality: Right

## 2017-10-06 MED ORDER — HYDROMORPHONE HCL 2 MG/ML IJ SOLN
2.0000 mg | Freq: Once | INTRAMUSCULAR | Status: AC
Start: 2017-10-06 — End: 2017-10-06
  Administered 2017-10-06: 2 mg via INTRAVENOUS
  Filled 2017-10-06: qty 1

## 2017-10-06 MED ORDER — KETOROLAC TROMETHAMINE 15 MG/ML IJ SOLN
15.0000 mg | Freq: Four times a day (QID) | INTRAMUSCULAR | Status: AC
  Administered 2017-10-06: 15 mg via INTRAVENOUS
  Filled 2017-10-06: qty 1

## 2017-10-06 MED ORDER — METHOCARBAMOL 500 MG PO TABS
500.0000 mg | ORAL_TABLET | Freq: Four times a day (QID) | ORAL | Status: DC | PRN
  Administered 2017-10-06 – 2017-10-07 (×3): 500 mg via ORAL
  Filled 2017-10-06 (×3): qty 1

## 2017-10-06 MED ORDER — ONDANSETRON HCL 4 MG/2ML IJ SOLN: 4.0000 mg | Freq: Four times a day (QID) | INTRAMUSCULAR | Status: DC | PRN

## 2017-10-06 MED ORDER — PHENYLEPHRINE HCL 10 MG/ML IJ SOLN
INTRAVENOUS | Status: DC | PRN
  Administered 2017-10-06: 30 ug/min via INTRAVENOUS

## 2017-10-06 MED ORDER — ASPIRIN EC 325 MG PO TBEC: 325.0000 mg | DELAYED_RELEASE_TABLET | Freq: Every day | ORAL | 0 refills | Status: DC

## 2017-10-06 MED ORDER — PROPOFOL 10 MG/ML IV BOLUS
INTRAVENOUS | Status: DC | PRN
  Administered 2017-10-06: 50 mg via INTRAVENOUS

## 2017-10-06 MED ORDER — METFORMIN HCL 850 MG PO TABS
850.0000 mg | ORAL_TABLET | Freq: Every day | ORAL | Status: DC
  Administered 2017-10-07: 850 mg via ORAL
  Filled 2017-10-06: qty 1

## 2017-10-06 MED ORDER — BUPIVACAINE LIPOSOME 1.3 % IJ SUSP
INTRAMUSCULAR | Status: DC | PRN
  Administered 2017-10-06: 09:00:00

## 2017-10-06 MED ORDER — MIDAZOLAM HCL 5 MG/5ML IJ SOLN
INTRAMUSCULAR | Status: DC | PRN
  Administered 2017-10-06: 2 mg via INTRAVENOUS

## 2017-10-06 MED ORDER — MORPHINE SULFATE (PF) 2 MG/ML IV SOLN
0.5000 mg | INTRAVENOUS | Status: DC | PRN
  Administered 2017-10-06 (×2): 1 mg via INTRAVENOUS
  Filled 2017-10-06 (×2): qty 1

## 2017-10-06 MED ORDER — MENTHOL 3 MG MT LOZG: 1.0000 | LOZENGE | OROMUCOSAL | Status: DC | PRN

## 2017-10-06 MED ORDER — ONDANSETRON HCL 4 MG PO TABS: 4.0000 mg | ORAL_TABLET | Freq: Three times a day (TID) | ORAL | 0 refills | Status: DC | PRN

## 2017-10-06 MED ORDER — FENTANYL CITRATE (PF) 100 MCG/2ML IJ SOLN: 25.0000 ug | INTRAMUSCULAR | Status: DC | PRN

## 2017-10-06 MED ORDER — OXYCODONE HCL 5 MG/5ML PO SOLN: 5.0000 mg | Freq: Once | ORAL | Status: DC | PRN

## 2017-10-06 MED ORDER — SORBITOL 70 % SOLN
30.0000 mL | Freq: Every day | Status: DC | PRN
Start: 2017-10-06 — End: 2017-10-07

## 2017-10-06 MED ORDER — SODIUM CHLORIDE FLUSH 0.9 % IV SOLN
INTRAVENOUS | Status: DC | PRN
  Administered 2017-10-06: 50 mL

## 2017-10-06 MED ORDER — 0.9 % SODIUM CHLORIDE (POUR BTL) OPTIME
TOPICAL | Status: DC | PRN
  Administered 2017-10-06: 1000 mL

## 2017-10-06 MED ORDER — MAGNESIUM CITRATE PO SOLN: 1.0000 | Freq: Once | ORAL | Status: DC | PRN

## 2017-10-06 MED ORDER — FENTANYL CITRATE (PF) 250 MCG/5ML IJ SOLN
INTRAMUSCULAR | Status: AC
  Filled 2017-10-06: qty 5

## 2017-10-06 MED ORDER — EPHEDRINE SULFATE 50 MG/ML IJ SOLN
INTRAMUSCULAR | Status: DC | PRN
  Administered 2017-10-06: 10 mg via INTRAVENOUS

## 2017-10-06 MED ORDER — ACETAMINOPHEN 500 MG PO TABS
1000.0000 mg | ORAL_TABLET | Freq: Three times a day (TID) | ORAL | Status: DC
  Administered 2017-10-06 – 2017-10-07 (×3): 1000 mg via ORAL
  Filled 2017-10-06 (×2): qty 2

## 2017-10-06 MED ORDER — MIDAZOLAM HCL 2 MG/2ML IJ SOLN
INTRAMUSCULAR | Status: AC
  Filled 2017-10-06: qty 2

## 2017-10-06 MED ORDER — BUPIVACAINE IN DEXTROSE 0.75-8.25 % IT SOLN
INTRATHECAL | Status: DC | PRN
  Administered 2017-10-06: 2 mL via INTRATHECAL

## 2017-10-06 MED ORDER — PROPOFOL 500 MG/50ML IV EMUL
INTRAVENOUS | Status: DC | PRN
  Administered 2017-10-06: 40 ug/kg/min via INTRAVENOUS

## 2017-10-06 MED ORDER — ONDANSETRON HCL 4 MG PO TABS
4.0000 mg | ORAL_TABLET | Freq: Four times a day (QID) | ORAL | Status: DC | PRN
Start: 2017-10-06 — End: 2017-10-07

## 2017-10-06 MED ORDER — ACETAMINOPHEN 325 MG PO TABS
325.0000 mg | ORAL_TABLET | Freq: Four times a day (QID) | ORAL | Status: DC | PRN
  Filled 2017-10-06 (×2): qty 2

## 2017-10-06 MED ORDER — EPHEDRINE 5 MG/ML INJ
INTRAVENOUS | Status: AC
  Filled 2017-10-06: qty 10

## 2017-10-06 MED ORDER — OMEPRAZOLE 20 MG PO CPDR: 20.0000 mg | DELAYED_RELEASE_CAPSULE | Freq: Every day | ORAL | 0 refills | Status: DC

## 2017-10-06 MED ORDER — PANTOPRAZOLE SODIUM 40 MG PO TBEC
40.0000 mg | DELAYED_RELEASE_TABLET | Freq: Every day | ORAL | Status: DC
  Administered 2017-10-07: 40 mg via ORAL
  Filled 2017-10-06: qty 1

## 2017-10-06 MED ORDER — ROSUVASTATIN CALCIUM 10 MG PO TABS: 10.0000 mg | ORAL_TABLET | Freq: Every day | ORAL | Status: DC

## 2017-10-06 MED ORDER — DEXAMETHASONE SODIUM PHOSPHATE 10 MG/ML IJ SOLN: 10.0000 mg | Freq: Once | INTRAMUSCULAR | Status: DC

## 2017-10-06 MED ORDER — LISINOPRIL-HYDROCHLOROTHIAZIDE 10-12.5 MG PO TABS: 1.0000 | ORAL_TABLET | Freq: Every day | ORAL | Status: DC

## 2017-10-06 MED ORDER — TRAMADOL HCL 50 MG PO TABS
50.0000 mg | ORAL_TABLET | Freq: Four times a day (QID) | ORAL | Status: DC
  Administered 2017-10-06 – 2017-10-07 (×2): 50 mg via ORAL
  Filled 2017-10-06 (×2): qty 1

## 2017-10-06 MED ORDER — PHENOL 1.4 % MT LIQD: 1.0000 | OROMUCOSAL | Status: DC | PRN

## 2017-10-06 MED ORDER — LACTATED RINGERS IV SOLN
INTRAVENOUS | Status: DC
  Administered 2017-10-06: 15:00:00 via INTRAVENOUS

## 2017-10-06 MED ORDER — PROMETHAZINE HCL 25 MG/ML IJ SOLN: 6.2500 mg | INTRAMUSCULAR | Status: DC | PRN

## 2017-10-06 MED ORDER — PIOGLITAZONE HCL 15 MG PO TABS
15.0000 mg | ORAL_TABLET | Freq: Every day | ORAL | Status: DC
  Administered 2017-10-07: 15 mg via ORAL
  Filled 2017-10-06 (×2): qty 1

## 2017-10-06 MED ORDER — DOCUSATE SODIUM 100 MG PO CAPS: 100.0000 mg | ORAL_CAPSULE | Freq: Two times a day (BID) | ORAL | 0 refills | Status: DC

## 2017-10-06 MED ORDER — HYDROCHLOROTHIAZIDE 12.5 MG PO CAPS
12.5000 mg | ORAL_CAPSULE | Freq: Every day | ORAL | Status: DC
  Administered 2017-10-07: 12.5 mg via ORAL
  Filled 2017-10-06: qty 1

## 2017-10-06 MED ORDER — CHLORHEXIDINE GLUCONATE 4 % EX LIQD: 60.0000 mL | Freq: Once | CUTANEOUS | Status: DC

## 2017-10-06 MED ORDER — PROPOFOL 10 MG/ML IV BOLUS
INTRAVENOUS | Status: AC
  Filled 2017-10-06: qty 20

## 2017-10-06 MED ORDER — METOCLOPRAMIDE HCL 5 MG PO TABS: 5.0000 mg | ORAL_TABLET | Freq: Three times a day (TID) | ORAL | Status: DC | PRN

## 2017-10-06 MED ORDER — METHOCARBAMOL 1000 MG/10ML IJ SOLN
500.0000 mg | Freq: Four times a day (QID) | INTRAVENOUS | Status: DC | PRN
  Filled 2017-10-06: qty 5

## 2017-10-06 MED ORDER — HYDROCODONE-ACETAMINOPHEN 5-325 MG PO TABS
1.0000 | ORAL_TABLET | ORAL | Status: DC | PRN
  Administered 2017-10-06 – 2017-10-07 (×3): 2 via ORAL
  Filled 2017-10-06 (×3): qty 2

## 2017-10-06 MED ORDER — POLYETHYLENE GLYCOL 3350 17 G PO PACK: 17.0000 g | PACK | Freq: Every day | ORAL | Status: DC | PRN

## 2017-10-06 MED ORDER — METHOCARBAMOL 500 MG PO TABS: 500.0000 mg | ORAL_TABLET | Freq: Four times a day (QID) | ORAL | 0 refills | Status: DC | PRN

## 2017-10-06 MED ORDER — DOCUSATE SODIUM 100 MG PO CAPS
100.0000 mg | ORAL_CAPSULE | Freq: Two times a day (BID) | ORAL | Status: DC
  Administered 2017-10-06 – 2017-10-07 (×2): 100 mg via ORAL
  Filled 2017-10-06 (×2): qty 1

## 2017-10-06 MED ORDER — OXYCODONE HCL 5 MG PO TABS: 5.0000 mg | ORAL_TABLET | Freq: Once | ORAL | Status: DC | PRN

## 2017-10-06 MED ORDER — LIDOCAINE 2% (20 MG/ML) 5 ML SYRINGE
INTRAMUSCULAR | Status: AC
  Filled 2017-10-06: qty 5

## 2017-10-06 MED ORDER — HYDROCODONE-ACETAMINOPHEN 5-325 MG PO TABS: 1.0000 | ORAL_TABLET | Freq: Four times a day (QID) | ORAL | 0 refills | Status: DC | PRN

## 2017-10-06 MED ORDER — ASPIRIN EC 325 MG PO TBEC
325.0000 mg | DELAYED_RELEASE_TABLET | Freq: Every day | ORAL | Status: DC
  Administered 2017-10-07: 325 mg via ORAL
  Filled 2017-10-06: qty 1

## 2017-10-06 MED ORDER — METOCLOPRAMIDE HCL 5 MG/ML IJ SOLN: 5.0000 mg | Freq: Three times a day (TID) | INTRAMUSCULAR | Status: DC | PRN

## 2017-10-06 MED ORDER — LISINOPRIL 10 MG PO TABS
10.0000 mg | ORAL_TABLET | Freq: Every day | ORAL | Status: DC
  Administered 2017-10-07: 10 mg via ORAL
  Filled 2017-10-06: qty 1

## 2017-10-06 MED ORDER — INSULIN ASPART 100 UNIT/ML ~~LOC~~ SOLN
0.0000 [IU] | SUBCUTANEOUS | Status: DC
  Administered 2017-10-06: 8 [IU] via SUBCUTANEOUS
  Administered 2017-10-06: 11 [IU] via SUBCUTANEOUS
  Administered 2017-10-07 (×2): 8 [IU] via SUBCUTANEOUS
  Administered 2017-10-07: 4 [IU] via SUBCUTANEOUS

## 2017-10-06 MED ORDER — DIPHENHYDRAMINE HCL 12.5 MG/5ML PO ELIX: 12.5000 mg | ORAL_SOLUTION | ORAL | Status: DC | PRN

## 2017-10-06 MED ORDER — POVIDONE-IODINE 10 % EX SWAB
2.0000 "application " | Freq: Once | CUTANEOUS | Status: AC
  Administered 2017-10-06: 2 via TOPICAL

## 2017-10-06 MED ORDER — CEFAZOLIN SODIUM-DEXTROSE 1-4 GM/50ML-% IV SOLN
1.0000 g | Freq: Four times a day (QID) | INTRAVENOUS | Status: AC
  Administered 2017-10-06 (×2): 1 g via INTRAVENOUS
  Filled 2017-10-06 (×2): qty 50

## 2017-10-06 SURGICAL SUPPLY — 55 items
BAG DECANTER FOR FLEXI CONT (MISCELLANEOUS) ×2 IMPLANT
BLADE SAG 18X100X1.27 (BLADE) ×2 IMPLANT
CLSR STERI-STRIP ANTIMIC 1/2X4 (GAUZE/BANDAGES/DRESSINGS) ×2 IMPLANT
COVER PERINEAL POST (MISCELLANEOUS) ×2 IMPLANT
COVER SURGICAL LIGHT HANDLE (MISCELLANEOUS) ×2 IMPLANT
DRAPE C-ARM 42X72 X-RAY (DRAPES) ×2 IMPLANT
DRAPE STERI IOBAN 125X83 (DRAPES) ×2 IMPLANT
DRAPE U-SHAPE 47X51 STRL (DRAPES) IMPLANT
DRSG MEPILEX BORDER 4X8 (GAUZE/BANDAGES/DRESSINGS) ×2 IMPLANT
DURAPREP 26ML APPLICATOR (WOUND CARE) ×2 IMPLANT
ELECT BLADE 4.0 EZ CLEAN MEGAD (MISCELLANEOUS) ×2
ELECT REM PT RETURN 9FT ADLT (ELECTROSURGICAL) ×2
ELECTRODE BLDE 4.0 EZ CLN MEGD (MISCELLANEOUS) ×1 IMPLANT
ELECTRODE REM PT RTRN 9FT ADLT (ELECTROSURGICAL) ×1 IMPLANT
FACESHIELD WRAPAROUND (MASK) ×6 IMPLANT
GLOVE BIO SURGEON STRL SZ7.5 (GLOVE) IMPLANT
GLOVE BIOGEL PI IND STRL 8 (GLOVE) ×2 IMPLANT
GLOVE BIOGEL PI INDICATOR 8 (GLOVE) ×2
GOWN STRL REUS W/ TWL LRG LVL3 (GOWN DISPOSABLE) ×2 IMPLANT
GOWN STRL REUS W/TWL LRG LVL3 (GOWN DISPOSABLE) ×2
HEAD BIOLOX HIP 36/-2.5 (Joint) ×1 IMPLANT
HIP BIOLOX HD 36/-2.5 (Joint) ×2 IMPLANT
INSERT TRIDENT POLY 36MM 0DEG (Insert) ×2 IMPLANT
KIT BASIN OR (CUSTOM PROCEDURE TRAY) ×2 IMPLANT
KIT TURNOVER KIT B (KITS) ×2 IMPLANT
MANIFOLD NEPTUNE II (INSTRUMENTS) ×2 IMPLANT
NDL SAFETY ECLIPSE 18X1.5 (NEEDLE) ×1 IMPLANT
NEEDLE HYPO 18GX1.5 SHARP (NEEDLE) ×1
NEEDLE HYPO 22GX1.5 SAFETY (NEEDLE) IMPLANT
NS IRRIG 1000ML POUR BTL (IV SOLUTION) ×2 IMPLANT
PACK TOTAL JOINT (CUSTOM PROCEDURE TRAY) ×2 IMPLANT
PAD ARMBOARD 7.5X6 YLW CONV (MISCELLANEOUS) ×2 IMPLANT
SCREW HEX LP 6.5X20 (Screw) ×2 IMPLANT
SHELL ACETABUL CLUSTER SZ 54 (Shell) ×2 IMPLANT
SPONGE LAP 18X18 X RAY DECT (DISPOSABLE) IMPLANT
STEM HIP 127 DEG (Stem) ×2 IMPLANT
STRIP CLOSURE SKIN 1/2X4 (GAUZE/BANDAGES/DRESSINGS) ×2 IMPLANT
SUT MNCRL AB 4-0 PS2 18 (SUTURE) ×2 IMPLANT
SUT MON AB 2-0 CT1 36 (SUTURE) ×2 IMPLANT
SUT VIC AB 0 CT1 27 (SUTURE) ×1
SUT VIC AB 0 CT1 27XBRD ANBCTR (SUTURE) ×1 IMPLANT
SUT VIC AB 1 CT1 27 (SUTURE) ×1
SUT VIC AB 1 CT1 27XBRD ANBCTR (SUTURE) ×1 IMPLANT
SUT VIC AB 2-0 CT1 27 (SUTURE) ×1
SUT VIC AB 2-0 CT1 TAPERPNT 27 (SUTURE) ×1 IMPLANT
SUT VLOC 180 0 24IN GS25 (SUTURE) ×2 IMPLANT
SYR 50ML LL SCALE MARK (SYRINGE) ×2 IMPLANT
SYR BULB IRRIGATION 50ML (SYRINGE) ×2 IMPLANT
SYRINGE 20CC LL (MISCELLANEOUS) IMPLANT
TOWEL OR 17X24 6PK STRL BLUE (TOWEL DISPOSABLE) ×2 IMPLANT
TOWEL OR 17X26 10 PK STRL BLUE (TOWEL DISPOSABLE) ×2 IMPLANT
TRAY CATH 16FR W/PLASTIC CATH (SET/KITS/TRAYS/PACK) ×2 IMPLANT
TRAY FOLEY W/METER SILVER 16FR (SET/KITS/TRAYS/PACK) IMPLANT
WATER STERILE IRR 1000ML POUR (IV SOLUTION) ×2 IMPLANT
YANKAUER SUCT BULB TIP NO VENT (SUCTIONS) ×4 IMPLANT

## 2017-10-06 NOTE — Addendum Note (Signed)
Addendum  created 10/06/17 1124 by Ponciano OrtBrewer, Antonique Langford, CRNA   Intraprocedure Flowsheets edited

## 2017-10-06 NOTE — Discharge Instructions (Signed)
You may bear weight as tolerated. °Keep your dressing on and dry until follow up. °Take medicine to prevent blood clots as directed. °Take pain medicine as needed with the goal of transitioning to over the counter medicines.  °If needed, you may increase pain medication for the first few days post op to 2 tablets every 4 hours. ° °INSTRUCTIONS AFTER JOINT REPLACEMENT  ° °o Remove items at home which could result in a fall. This includes throw rugs or furniture in walking pathways °o ICE to the affected joint every three hours while awake for 30 minutes at a time, for at least the first 3-5 days, and then as needed for pain and swelling.  Continue to use ice for pain and swelling. You may notice swelling that will progress down to the foot and ankle.  This is normal after surgery.  Elevate your leg when you are not up walking on it.   °o Continue to use the breathing machine you got in the hospital (incentive spirometer) which will help keep your temperature down.  It is common for your temperature to cycle up and down following surgery, especially at night when you are not up moving around and exerting yourself.  The breathing machine keeps your lungs expanded and your temperature down. ° ° °DIET:  As you were doing prior to hospitalization, we recommend a well-balanced diet. ° °DRESSING / WOUND CARE / SHOWERING ° °You may shower 3 days after surgery, but keep the wounds dry during showering.  You may use an occlusive plastic wrap (Press'n Seal for example) with blue painter's tape at edges, NO SOAKING/SUBMERGING IN THE BATHTUB.  If the bandage gets wet, change with a clean dry gauze.  If the incision gets wet, pat the wound dry with a clean towel. ° °ACTIVITY ° °o Increase activity slowly as tolerated, but follow the weight bearing instructions below.   °o No driving for 6 weeks or until further direction given by your physician.  You cannot drive while taking narcotics.  °o No lifting or carrying greater than 10  lbs. until further directed by your surgeon. °o Avoid periods of inactivity such as sitting longer than an hour when not asleep. This helps prevent blood clots.  °o You may return to work once you are authorized by your doctor.  ° ° ° °WEIGHT BEARING  ° °Weight bearing as tolerated with assist device (walker, cane, etc) as directed, use it as long as suggested by your surgeon or therapist, typically at least 4-6 weeks. ° ° °EXERCISES ° °Results after joint replacement surgery are often greatly improved when you follow the exercise, range of motion and muscle strengthening exercises prescribed by your doctor. Safety measures are also important to protect the joint from further injury. Any time any of these exercises cause you to have increased pain or swelling, decrease what you are doing until you are comfortable again and then slowly increase them. If you have problems or questions, call your caregiver or physical therapist for advice.  ° °Rehabilitation is important following a joint replacement. After just a few days of immobilization, the muscles of the leg can become weakened and shrink (atrophy).  These exercises are designed to build up the tone and strength of the thigh and leg muscles and to improve motion. Often times heat used for twenty to thirty minutes before working out will loosen up your tissues and help with improving the range of motion but do not use heat for the first two   weeks following surgery (sometimes heat can increase post-operative swelling).  ° °These exercises can be done on a training (exercise) mat, on the floor, on a table or on a bed. Use whatever works the best and is most comfortable for you.    Use music or television while you are exercising so that the exercises are a pleasant break in your day. This will make your life better with the exercises acting as a break in your routine that you can look forward to.   Perform all exercises about fifteen times, three times per day or as  directed.  You should exercise both the operative leg and the other leg as well. ° °Exercises include: °  °• Quad Sets - Tighten up the muscle on the front of the thigh (Quad) and hold for 5-10 seconds.   °• Straight Leg Raises - With your knee straight (if you were given a brace, keep it on), lift the leg to 60 degrees, hold for 3 seconds, and slowly lower the leg.  Perform this exercise against resistance later as your leg gets stronger.  °• Leg Slides: Lying on your back, slowly slide your foot toward your buttocks, bending your knee up off the floor (only go as far as is comfortable). Then slowly slide your foot back down until your leg is flat on the floor again.  °• Angel Wings: Lying on your back spread your legs to the side as far apart as you can without causing discomfort.  °• Hamstring Strength:  Lying on your back, push your heel against the floor with your leg straight by tightening up the muscles of your buttocks.  Repeat, but this time bend your knee to a comfortable angle, and push your heel against the floor.  You may put a pillow under the heel to make it more comfortable if necessary.  ° °A rehabilitation program following joint replacement surgery can speed recovery and prevent re-injury in the future due to weakened muscles. Contact your doctor or a physical therapist for more information on knee rehabilitation.  ° ° °CONSTIPATION ° °Constipation is defined medically as fewer than three stools per week and severe constipation as less than one stool per week.  Even if you have a regular bowel pattern at home, your normal regimen is likely to be disrupted due to multiple reasons following surgery.  Combination of anesthesia, postoperative narcotics, change in appetite and fluid intake all can affect your bowels.  ° °YOU MUST use at least one of the following options; they are listed in order of increasing strength to get the job done.  They are all available over the counter, and you may need to  use some, POSSIBLY even all of these options:   ° °Drink plenty of fluids (prune juice may be helpful) and high fiber foods °Colace 100 mg by mouth twice a day  °Senokot for constipation as directed and as needed Dulcolax (bisacodyl), take with full glass of water  °Miralax (polyethylene glycol) once or twice a day as needed. ° °If you have tried all these things and are unable to have a bowel movement in the first 3-4 days after surgery call either your surgeon or your primary doctor.   ° °If you experience loose stools or diarrhea, hold the medications until you stool forms back up.  If your symptoms do not get better within 1 week or if they get worse, check with your doctor.  If you experience "the worst abdominal pain ever" or   develop nausea or vomiting, please contact the office immediately for further recommendations for treatment. ° ° °ITCHING:  If you experience itching with your medications, try taking only a single pain pill, or even half a pain pill at a time.  You can also use Benadryl over the counter for itching or also to help with sleep.  ° °TED HOSE STOCKINGS:  Use stockings on both legs until for at least 2 weeks or as directed by physician office. They may be removed at night for sleeping. ° °MEDICATIONS:  See your medication summary on the “After Visit Summary” that nursing will review with you.  You may have some home medications which will be placed on hold until you complete the course of blood thinner medication.  It is important for you to complete the blood thinner medication as prescribed. ° °PRECAUTIONS:  If you experience chest pain or shortness of breath - call 911 immediately for transfer to the hospital emergency department.  ° °If you develop a fever greater that 101 F, purulent drainage from wound, increased redness or drainage from wound, foul odor from the wound/dressing, or calf pain - CONTACT YOUR SURGEON.   °                                                °FOLLOW-UP  APPOINTMENTS:  If you do not already have a post-op appointment, please call the office for an appointment to be seen by your surgeon.  Guidelines for how soon to be seen are listed in your “After Visit Summary”, but are typically between 1-4 weeks after surgery. ° °OTHER INSTRUCTIONS:  ° ° ° °MAKE SURE YOU:  °• Understand these instructions.  °• Get help right away if you are not doing well or get worse.  ° ° °Thank you for letting us be a part of your medical care team.  It is a privilege we respect greatly.  We hope these instructions will help you stay on track for a fast and full recovery!  ° ° ° ° °

## 2017-10-06 NOTE — Evaluation (Signed)
Physical Therapy Evaluation Patient Details Name: Jonathon Doyle MRN: [id number] DOB: Oct 26, 1960 Today's Date: 10/06/2017   History of Present Illness  Pt is a 57 y/o male s/p elective R THA, direct anterior approach. PMH includes DM and HTN.   Clinical Impression  Pt is s/p surgery above with deficits below. Pt tolerated gait well and required min guard A for mobility with RW. Verbally reviewed supine HEP with pt and pt's family as pt wanting to eat lunch upon return to room. Will continue to follow acutely to maximize functional mobility independence and safety.     Follow Up Recommendations Follow surgeon's recommendation for DC plan and follow-up therapies;Supervision for mobility/OOB    Equipment Recommendations  Rolling walker with 5" wheels;3in1 (PT)    Recommendations for Other Services       Precautions / Restrictions Precautions Precautions: None Precaution Comments: Ther ex deferred as pt wanting to eat. Verbally reviewed ther ex with pt and pt's wife.  Restrictions Weight Bearing Restrictions: Yes RLE Weight Bearing: Weight bearing as tolerated      Mobility  Bed Mobility Overal bed mobility: Needs Assistance Bed Mobility: Sidelying to Sit   Sidelying to sit: Min assist       General bed mobility comments: Min A for assist with trunk elevation. Increased time required.   Transfers Overall transfer level: Needs assistance Equipment used: Rolling walker (2 wheeled) Transfers: Sit to/from Stand Sit to Stand: Min assist         General transfer comment: Min A for lift assist and steadying assist. Verbal cues for safe hand placement.   Ambulation/Gait Ambulation/Gait assistance: Min guard Ambulation Distance (Feet): 50 Feet Assistive device: Rolling walker (2 wheeled) Gait Pattern/deviations: Step-to pattern;Decreased step length - right;Decreased step length - left;Decreased weight shift to right;Antalgic Gait velocity: Decreased  Gait velocity  interpretation: <1.31 ft/sec, indicative of household ambulator General Gait Details: Slow, antalgic gait. Verbal cues for increased step height on RLE. Verbal cues for sequencing using RW.   Stairs            Wheelchair Mobility    Modified Rankin (Stroke Patients Only)       Balance Overall balance assessment: Needs assistance Sitting-balance support: No upper extremity supported;Feet supported Sitting balance-Leahy Scale: Good     Standing balance support: Bilateral upper extremity supported;During functional activity Standing balance-Leahy Scale: Poor Standing balance comment: Reliant on BUE support.                              Pertinent Vitals/Pain Pain Assessment: 0-10 Pain Score: 8  Pain Location: R hip  Pain Descriptors / Indicators: Aching;Operative site guarding Pain Intervention(s): Limited activity within patient's tolerance;Monitored during session;Repositioned    Home Living Family/patient expects to be discharged to:: Private residence Living Arrangements: Spouse/significant other Available Help at Discharge: Family;Available 24 hours/day Type of Home: House Home Access: Stairs to enter Entrance Stairs-Rails: Right Entrance Stairs-Number of Steps: 2 Home Layout: One level Home Equipment: Cane - quad      Prior Function Level of Independence: Independent with assistive device(s)         Comments: Used cane for ambulation      Hand Dominance        Extremity/Trunk Assessment   Upper Extremity Assessment Upper Extremity Assessment: Defer to OT evaluation    Lower Extremity Assessment Lower Extremity Assessment: RLE deficits/detail RLE Deficits / Details: Sensory in tact. Deficits consistent with post op pain  and weakness.     Cervical / Trunk Assessment Cervical / Trunk Assessment: Normal  Communication   Communication: No difficulties  Cognition Arousal/Alertness: Awake/alert Behavior During Therapy: WFL for tasks  assessed/performed Overall Cognitive Status: Within Functional Limits for tasks assessed                                        General Comments General comments (skin integrity, edema, etc.): Pt's wife present during session.     Exercises     Assessment/Plan    PT Assessment Patient needs continued PT services  PT Problem List Decreased strength;Decreased balance;Decreased mobility;Decreased knowledge of use of DME;Decreased knowledge of precautions;Pain       PT Treatment Interventions DME instruction;Gait training;Functional mobility training;Stair training;Therapeutic activities;Therapeutic exercise;Balance training;Neuromuscular re-education;Patient/family education    PT Goals (Current goals can be found in the Care Plan section)  Acute Rehab PT Goals PT Goal Formulation: With patient Time For Goal Achievement: 10/20/17 Potential to Achieve Goals: Good    Frequency 7X/week   Barriers to discharge        Co-evaluation               AM-PAC PT "6 Clicks" Daily Activity  Outcome Measure Difficulty turning over in bed (including adjusting bedclothes, sheets and blankets)?: A Little Difficulty moving from lying on back to sitting on the side of the bed? : Unable Difficulty sitting down on and standing up from a chair with arms (e.g., wheelchair, bedside commode, etc,.)?: Unable Help needed moving to and from a bed to chair (including a wheelchair)?: A Little Help needed walking in hospital room?: A Little Help needed climbing 3-5 steps with a railing? : A Little 6 Click Score: 14    End of Session Equipment Utilized During Treatment: Gait belt Activity Tolerance: Patient tolerated treatment well Patient left: in chair;with call bell/phone within reach;with family/visitor present Nurse Communication: Mobility status PT Visit Diagnosis: Other abnormalities of gait and mobility (R26.89);Pain Pain - Right/Left: Right Pain - part of body: Hip     Time: [id number] PT Time Calculation (min) (ACUTE ONLY): 24 min   Charges:   PT Evaluation $PT Eval Low Complexity: 1 Low PT Treatments $Gait Training: 8-22 mins   PT G Codes:        Gladys DammeBrittany Raysha Tilmon, PT, DPT  Acute Rehabilitation Services  Pager: [phone]   Lehman PromBrittany S Avelynn Sellin 10/06/2017, 3:12 PM

## 2017-10-06 NOTE — Anesthesia Procedure Notes (Signed)
Spinal  Patient location during procedure: OR Start time: 10/06/2017 7:20 AM End time: 10/06/2017 7:24 AM Staffing Anesthesiologist: Beryle LatheBrock, Thomas E, MD Performed: anesthesiologist  Preanesthetic Checklist Completed: patient identified, surgical consent, pre-op evaluation, timeout performed, IV checked, risks and benefits discussed and monitors and equipment checked Spinal Block Patient position: sitting Prep: DuraPrep Patient monitoring: heart rate, cardiac monitor, continuous pulse ox and blood pressure Approach: midline Location: L3-4 Injection technique: single-shot Needle Needle type: Pencan  Needle gauge: 24 G Additional Notes Functioning IV was confirmed and monitors were applied. Sterile prep and drape, including hand hygiene, mask, and sterile gloves were used. The patient was positioned and the spine was prepped. The skin was anesthetized with lidocaine. Free flow of clear CSF was obtained prior to injecting local anesthetic into the CSF. The spinal needle aspirated freely following injection. The needle was carefully withdrawn. The patient tolerated the procedure well. Consent was obtained prior to the procedure with all questions answered and concerns addressed. Risks including, but not limited to, bleeding, infection, nerve damage, paralysis, failed block, inadequate analgesia, allergic reaction, high spinal, itching, and headache were discussed and the patient wished to proceed.  Leslye Peerhomas Brock, MD

## 2017-10-06 NOTE — Interval H&P Note (Signed)
History and Physical Interval Note:  10/06/2017 7:19 AM  Jonathon Doyle  has presented today for surgery, with the diagnosis of OA RIGHT HIP  The various methods of treatment have been discussed with the patient and family. After consideration of risks, benefits and other options for treatment, the patient has consented to  Procedure(s): TOTAL HIP ARTHROPLASTY ANTERIOR APPROACH (Right) as a surgical intervention .  The patient's history has been reviewed, patient examined, no change in status, stable for surgery.  I have reviewed the patient's chart and labs.  Questions were answered to the patient's satisfaction.     MURPHY, TIMOTHY D

## 2017-10-06 NOTE — Anesthesia Postprocedure Evaluation (Signed)
Anesthesia Post Note  Patient: Haskel KhanFrank J Drew  Procedure(s) Performed: TOTAL HIP ARTHROPLASTY ANTERIOR APPROACH (Right Hip)     Patient location during evaluation: PACU Anesthesia Type: Spinal Level of consciousness: awake and alert Pain management: pain level controlled Vital Signs Assessment: post-procedure vital signs reviewed and stable Respiratory status: spontaneous breathing and respiratory function stable Cardiovascular status: blood pressure returned to baseline and stable Postop Assessment: spinal receding and no apparent nausea or vomiting Anesthetic complications: no    Last Vitals:  Vitals:   10/06/17 1104 10/06/17 1114  BP: 115/66   Pulse: (!) 57   Resp: 14   Temp:  36.6 C  SpO2: 99%     Last Pain:  Vitals:   10/06/17 1114  TempSrc:   PainSc: 0-No pain    LLE Motor Response: Responds to commands (10/06/17 1114)   RLE Motor Response: Responds to commands (10/06/17 1114)   L Sensory Level: L5-Outer lower leg, top of foot, great toe (10/06/17 1114) R Sensory Level: L5-Outer lower leg, top of foot, great toe (10/06/17 1114)  Beryle Lathehomas E Brock

## 2017-10-06 NOTE — Transfer of Care (Signed)
Immediate Anesthesia Transfer of Care Note  Patient: Jonathon Doyle  Procedure(s) Performed: TOTAL HIP ARTHROPLASTY ANTERIOR APPROACH (Right Hip)  Patient Location: PACU  Anesthesia Type:MAC and Spinal  Level of Consciousness: awake, alert , oriented and patient cooperative  Airway & Oxygen Therapy: Patient Spontanous Breathing and Patient connected to face mask oxygen  Post-op Assessment: Report given to RN and Post -op Vital signs reviewed and stable  Post vital signs: Reviewed and stable  Last Vitals:  Vitals Value Taken Time  BP 123/59 10/06/2017  9:49 AM  Temp    Pulse 71 10/06/2017  9:51 AM  Resp 12 10/06/2017  9:51 AM  SpO2 100 % 10/06/2017  9:51 AM  Vitals shown include unvalidated device data.  Last Pain:  Vitals:   10/06/17 0950  TempSrc:   PainSc: (P) 0-No pain      Patients Stated Pain Goal: 4 (16/10/96 0454)  Complications: No apparent anesthesia complications

## 2017-10-06 NOTE — Op Note (Signed)
10/06/2017  9:05 AM  PATIENT:  Jonathon Doyle   MRN: [id number]  PRE-OPERATIVE DIAGNOSIS:  OA RIGHT HIP  POST-OPERATIVE DIAGNOSIS:  OA RIGHT HIP  PROCEDURE:  Procedure(s): TOTAL HIP ARTHROPLASTY ANTERIOR APPROACH  PREOPERATIVE INDICATIONS:    Jonathon Doyle is an 57 y.o. male who has a diagnosis of Primary osteoarthritis of right hip and elected for surgical management after failing conservative treatment.  The risks benefits and alternatives were discussed with the patient including but not limited to the risks of nonoperative treatment, versus surgical intervention including infection, bleeding, nerve injury, periprosthetic fracture, the need for revision surgery, dislocation, leg length discrepancy, blood clots, cardiopulmonary complications, morbidity, mortality, among others, and they were willing to proceed.     OPERATIVE REPORT     SURGEON:   Lindy Pennisi, Ernesta Amble, MD    ASSISTANT:  Roxan Hockey, PA-C, he was present and scrubbed throughout the case, critical for completion in a timely fashion, and for retraction, instrumentation, and closure.     ANESTHESIA:  General    COMPLICATIONS:  None.     COMPONENTS:  [employer]ker acolade fit femur size 5 with a 36 mm -2.5 head ball and a PSL acetabular shell size 54 with a  polyethylene liner    PROCEDURE IN DETAIL:   The patient was met in the holding area and  identified.  The appropriate hip was identified and marked at the operative site.  The patient was then transported to the OR  and  placed under anesthesia per that record.  At that point, the patient was  placed in the supine position and  secured to the operating room table and all bony prominences padded. He received pre-operative antibiotics    The operative lower extremity was prepped from the iliac crest to the distal leg.  Sterile draping was performed.  Time out was performed prior to incision.      Skin incision was made just 2 cm lateral to the ASIS  extending in  line with the tensor fascia lata. Electrocautery was used to control all bleeders. I dissected down sharply to the fascia of the tensor fascia lata was confirmed that the muscle fibers beneath were running posteriorly. I then incised the fascia over the superficial tensor fascia lata in line with the incision. The fascia was elevated off the anterior aspect of the muscle the muscle was retracted posteriorly and protected throughout the case. I then used electrocautery to incise the tensor fascia lata fascia control and all bleeders. Immediately visible was the fat over top of the anterior neck and capsule.  I removed the anterior fat from the capsule and elevated the rectus muscle off of the anterior capsule. I then removed a large time of capsule. The retractors were then placed over the anterior acetabulum as well as around the superior and inferior neck.  I then removed a section of the femoral neck and a napkin ring fashion. Then used the power course to remove the femoral head from the acetabulum and thoroughly irrigated the acetabulum. I sized the femoral head.    I then exposed the deep acetabulum, cleared out any tissue including the ligamentum teres.   After adequate visualization, I excised the labrum, and then sequentially reamed.  I then impacted the acetabular implant into place using fluoroscopy for guidance.  Appropriate version and inclination was confirmed clinically matching their bony anatomy, and with fluoroscopy.  I placed a 20 mm screw in the posterior/superio position with an excellent bite.  I then placed the polyethylene liner in place  I then adducted the leg and released the external rotators from the posterior femur allowing it to be easily delivered up lateral and anterior to the acetabulum for preparation of the femoral canal.    I then prepared the proximal femur using the cookie-cutter and then sequentially reamed and broached.  A trial broach, neck, and head was  utilized, and I reduced the hip and used floroscopy to assess the neck length and femoral implant.  I then impacted the femoral prosthesis into place into the appropriate version. The hip was then reduced and fluoroscopy confirmed appropriate position. Leg lengths were restored.  I then irrigated the hip copiously again with, and repaired the fascia with Vicryl, followed by monocryl for the subcutaneous tissue, Monocryl for the skin, Steri-Strips and sterile gauze. The patient was then awakened and returned to PACU in stable and satisfactory condition. There were no complications.  POST OPERATIVE PLAN: WBAT, DVT px: SCD's/TED, ambulation and chemical dvt px  Willette Mudry, MD Orthopedic Surgeon [phone]     

## 2017-10-07 ENCOUNTER — Inpatient Hospital Stay (HOSPITAL_COMMUNITY): Payer: 59

## 2017-10-07 ENCOUNTER — Encounter (HOSPITAL_COMMUNITY): Payer: Self-pay | Admitting: Orthopedic Surgery

## 2017-10-07 LAB — GLUCOSE, CAPILLARY
GLUCOSE-CAPILLARY: 169 mg/dL — AB (ref 65–99)
GLUCOSE-CAPILLARY: 230 mg/dL — AB (ref 65–99)
Glucose-Capillary: 235 mg/dL — ABNORMAL HIGH (ref 65–99)

## 2017-10-07 MED ORDER — HYDROMORPHONE HCL 2 MG/ML IJ SOLN: 0.5000 mg | INTRAMUSCULAR | Status: DC | PRN

## 2017-10-07 MED ORDER — HYDROMORPHONE HCL 2 MG/ML IJ SOLN
0.5000 mg | INTRAMUSCULAR | Status: DC | PRN
  Administered 2017-10-07 (×3): 1 mg via INTRAVENOUS
  Filled 2017-10-07 (×4): qty 1

## 2017-10-07 MED ORDER — DEXAMETHASONE SODIUM PHOSPHATE 10 MG/ML IJ SOLN
10.0000 mg | Freq: Once | INTRAMUSCULAR | Status: AC
  Administered 2017-10-07: 10 mg via INTRAVENOUS
  Filled 2017-10-07: qty 1

## 2017-10-07 MED ORDER — METHYLPREDNISOLONE 4 MG PO TBPK: ORAL_TABLET | ORAL | 0 refills | Status: DC

## 2017-10-07 NOTE — Progress Notes (Signed)
Pt had 10/10 pain. Called NP Genelle BalKirsten Shepperson. Ordered rt hip complete and lateral xrays to check for dislocation.  Pt stated he is "not doing that. No way. I will be in too much pain." Pt stated he had a slipped disc.  Gave pt 1 mg of dilaudid. Re-asked pt about the xrays an hour later. Still refused.

## 2017-10-07 NOTE — Progress Notes (Signed)
RN gave pt discharge instructions , pt stated understanding and did not have any questions. Comfortable and waiting for ride

## 2017-10-07 NOTE — Evaluation (Signed)
Occupational Therapy Evaluation Patient Details Name: Jonathon Doyle MRN: [id number] DOB: 03/22/1961 Today's Date: 10/07/2017    History of Present Illness Pt is a 57 y/o male s/p elective R THA, direct anterior approach. PMH includes DM and HTN.    Clinical Impression   PATIENT WAS SEEN FOR SKILLED OT TO MAXIMIZE I AND SAFETY WITH ADLS AND MOBILITY. PATIENT WAS EDUCATED ON USE OF AE FOR LE ADLS AND WAS ABLE TO RETURN DEMO. PATIENT WIFE HAS ALREADY PURCHASED KIT. PATIENT IS AN AGEEMENT WITH HHOT. PATIENT IS D/C TODAY FROM HOSPITAL.    Follow Up Recommendations       Equipment Recommendations       Recommendations for Other Services       Precautions / Restrictions Precautions Precautions: None Precaution Comments: ANERIOR APPROACH Restrictions Weight Bearing Restrictions: Yes RLE Weight Bearing: Weight bearing as tolerated      Mobility Bed Mobility                  Transfers       Sit to Stand: Supervision              Balance                                           ADL either performed or assessed with clinical judgement   ADL Overall ADL's : Needs assistance/impaired             Lower Body Bathing: Supervison/ safety;With adaptive equipment       Lower Body Dressing: Supervision/safety;With adaptive equipment   Toilet Transfer: Supervision/safety;Ambulation;BSC   Toileting- Water quality scientist and Hygiene: Independent       Functional mobility during ADLs: Supervision/safety General ADL Comments: PATIENT EDUCATED ON USE OF AE FOR LE ADLS.     Vision Baseline Vision/History: Wears glasses Wears Glasses: Reading only       Perception     Praxis      Pertinent Vitals/Pain Pain Assessment: 0-10 Pain Score: 3  Pain Location: (R HIP) Pain Descriptors / Indicators: Aching;Operative site guarding Pain Intervention(s): Monitored during session;Premedicated before session     Hand Dominance      Extremity/Trunk Assessment Upper Extremity Assessment Upper Extremity Assessment: Overall WFL for tasks assessed           Communication Communication Communication: No difficulties   Cognition Arousal/Alertness: Awake/alert Behavior During Therapy: WFL for tasks assessed/performed Overall Cognitive Status: Within Functional Limits for tasks assessed                                     General Comments       Exercises     Shoulder Instructions      Home Living Family/patient expects to be discharged to:: Private residence Living Arrangements: Spouse/significant other Available Help at Discharge: Family;Available 24 hours/day Type of Home: House Home Access: Stairs to enter [employer] of Steps: 2 Entrance Stairs-Rails: Right Home Layout: One level     Bathroom Shower/Tub: Occupational psychologist: Standard     Home Equipment: Cane - quad;Bedside commode;Walker - 2 wheels          Prior Functioning/Environment Level of Independence: Independent with assistive device(s)        Comments: Used cane for ambulation  OT Problem List:        OT Treatment/Interventions:      OT Goals(Current goals can be found in the care plan section) Acute Rehab OT Goals Patient Stated Goal: go home  OT Frequency:     Barriers to D/C:            Co-evaluation              AM-PAC PT "6 Clicks" Daily Activity     Outcome Measure Help from another person eating meals?: None Help from another person taking care of personal grooming?: None Help from another person toileting, which includes using toliet, bedpan, or urinal?: A Little Help from another person bathing (including washing, rinsing, drying)?: A Little Help from another person to put on and taking off regular upper body clothing?: None Help from another person to put on and taking off regular lower body clothing?: A Little 6 Click Score: 21   End of Session     Activity Tolerance:   Patient left:                     Time: 1055-1120 OT Time Calculation (min): 25 min Charges:  OT General Charges $OT Visit: 1 Visit OT Evaluation $OT Eval Low Complexity: 1 Low OT Treatments $Self Care/Home Management : 8-22 mins G-Codes:     6 CLICKS  Raianna Slight 10/07/2017, 1:55 PM

## 2017-10-07 NOTE — Discharge Summary (Signed)
Discharge Summary  Patient ID: Jonathon Doyle MRN: [id number] DOB/AGE: Aug 22, 1960 57 y.o.  Admit date: 10/06/2017 Discharge date: 10/07/2017  Admission Diagnoses:  Primary osteoarthritis of right hip  Discharge Diagnoses:  Principal Problem:   Primary osteoarthritis of right hip Active Problems:   Diabetes mellitus without complication (HCC)   Primary osteoarthritis of hip   Past Medical History:  Diagnosis Date  . Diabetes mellitus without complication (HCC)   . Hypertension   . Obesity     Surgeries: Procedure(s): TOTAL HIP ARTHROPLASTY ANTERIOR APPROACH on 10/06/2017   Consultants (if any):   Discharged Condition: Improved  Hospital Course: Jonathon Doyle is an 57 y.o. male who was admitted 10/06/2017 with a diagnosis of Primary osteoarthritis of right hip and went to the operating room on 10/06/2017 and underwent the above named procedures.    He was given perioperative antibiotics:  Anti-infectives (From admission, onward)   Start     Dose/Rate Route Frequency Ordered Stop   10/06/17 1400  ceFAZolin (ANCEF) IVPB 1 g/50 mL premix     1 g 100 mL/hr over 30 Minutes Intravenous Every 6 hours 10/06/17 1146 10/06/17 2039   10/06/17 0630  ceFAZolin (ANCEF) IVPB 2g/100 mL premix     2 g 200 mL/hr over 30 Minutes Intravenous To [facility]Stay [facility] 10/05/17 1138 10/06/17 0750    .  He was given sequential compression devices, early ambulation, and Aspirin for DVT prophylaxis.  He benefited maximally from the hospital stay and there were no complications.    Recent vital signs:  Vitals:   10/06/17 2307 10/07/17 0445  BP: 121/68 123/67  Pulse: 84 (!) 110  Resp: 18 18  Temp: 99.4 F (37.4 C) 99.5 F (37.5 C)  SpO2: 95% 91%    Recent laboratory studies:  Lab Results  Component Value Date   HGB 13.9 09/25/2017   Lab Results  Component Value Date   WBC 7.0 09/25/2017   PLT 241 09/25/2017   No results found for: INR Lab Results  Component Value Date   NA  137 09/25/2017   K 4.3 09/25/2017   CL 103 09/25/2017   CO2 22 09/25/2017   BUN 15 09/25/2017   CREATININE 0.86 09/25/2017   GLUCOSE 154 (H) 09/25/2017    Discharge Medications:   Allergies as of 10/07/2017      Reactions   Latex Itching, Swelling, Rash   SWELLING REACTION UNSPECIFIED       Medication List    STOP taking these medications   cyclobenzaprine 10 MG tablet Commonly known as:  FLEXERIL     TAKE these medications   aspirin EC 325 MG tablet Take 1 tablet (325 mg total) by mouth daily. For 30 days post op for DVT Prophylaxis   diclofenac 50 MG EC tablet Commonly known as:  VOLTAREN Take 50 mg by mouth 3 (three) times daily as needed for pain.   docusate sodium 100 MG capsule Commonly known as:  COLACE Take 1 capsule (100 mg total) by mouth 2 (two) times daily. To prevent constipation while taking pain medication.   HYDROcodone-acetaminophen 5-325 MG tablet Commonly known as:  NORCO Take 1-2 tablets by mouth every 6 (six) hours as needed for moderate pain.   lisinopril-hydrochlorothiazide 10-12.5 MG tablet Commonly known as:  PRINZIDE,ZESTORETIC Take 1 tablet by mouth daily.   loratadine 10 MG tablet Commonly known as:  CLARITIN Take 10 mg by mouth daily.   metFORMIN 850 MG tablet Commonly known as:  GLUCOPHAGE Take 850 mg  by mouth daily with breakfast. with food   methocarbamol 500 MG tablet Commonly known as:  ROBAXIN Take 1 tablet (500 mg total) by mouth every 6 (six) hours as needed for muscle spasms.   methylPREDNISolone 4 MG Tbpk tablet Commonly known as:  MEDROL DOSEPAK Take according to package direction -  6 tabs day 1.   5 tabs day 2.   4 tabs day 3.   3 tabs day 4.   2 tabs day 5.   1 tab day 6.   omeprazole 20 MG capsule Commonly known as:  PRILOSEC Take 1 capsule (20 mg total) by mouth daily. 30 days for gastroprotection while taking NSAIDs.   ondansetron 4 MG tablet Commonly known as:  ZOFRAN Take 1 tablet (4 mg total) by  mouth every 8 (eight) hours as needed for nausea or vomiting.   pioglitazone 15 MG tablet Commonly known as:  ACTOS Take 15 mg by mouth daily.   rosuvastatin 10 MG tablet Commonly known as:  CRESTOR Take 10 mg by mouth daily.   traMADol 50 MG tablet Commonly known as:  ULTRAM Take 100 mg by mouth 3 (three) times daily as needed for pain.       Diagnostic Studies: Dg C-arm 1-60 Min  Result Date: 10/06/2017 CLINICAL DATA:  [age] year old male post right hip replacement. Initial encounter. EXAM: DG C-ARM 61-120 MIN; OPERATIVE RIGHT HIP WITH PELVIS Fluoroscopic time: 17 seconds. COMPARISON:  01/09/2017 hip films. FINDINGS: Two intraoperative C-arm views submitted for review after surgery. Post total right hip replacement which appears in satisfactory position without complication noted on frontal projection. Sclerotic focus right ilium similar to prior exam. IMPRESSION: Post total right hip replacement. Sclerotic focus right ilium similar to prior exam. Electronically Signed   By: Lacy DuverneySteven  Olson M.D.   On: 10/06/2017 09:23   Dg Hip Operative Unilat W Or W/o Pelvis Right  Result Date: 10/06/2017 CLINICAL DATA:  [age] year old male post right hip replacement. Initial encounter. EXAM: DG C-ARM 61-120 MIN; OPERATIVE RIGHT HIP WITH PELVIS Fluoroscopic time: 17 seconds. COMPARISON:  01/09/2017 hip films. FINDINGS: Two intraoperative C-arm views submitted for review after surgery. Post total right hip replacement which appears in satisfactory position without complication noted on frontal projection. Sclerotic focus right ilium similar to prior exam. IMPRESSION: Post total right hip replacement. Sclerotic focus right ilium similar to prior exam. Electronically Signed   By: Lacy DuverneySteven  Olson M.D.   On: 10/06/2017 09:23    Disposition: Discharge disposition: 01-Home or Self Care       Follow-up Information    Sheral ApleyMurphy, Timothy D, MD.   Specialty:  Orthopedic Surgery Contact information: [address] [place] [id number] [phone]            Signed: Albina BilletHenry Calvin Martensen III PA-C 10/07/2017, 7:53 AM

## 2017-10-07 NOTE — Progress Notes (Signed)
    Subjective: Patient reports pain as moderate to severe.  Improved early morning.  Tolerating diet.  Urinating.  No CP, SOB.  OOB in room and with PT.  Objective:   VITALS:   Vitals:   10/06/17 1127 10/06/17 1953 10/06/17 2307 10/07/17 0445  BP: 113/66 115/78 121/68 123/67  Pulse: (!) 59 85 84 (!) 110  Resp: 14 17 18 18   Temp: 98 F (36.7 C) 99.3 F (37.4 C) 99.4 F (37.4 C) 99.5 F (37.5 C)  TempSrc: Oral Oral Oral Oral  SpO2: 98% 96% 95% 91%  Weight:      Height:       CBC Latest Ref Rng & Units 09/25/2017  WBC 4.0 - 10.5 K/uL 7.0  Hemoglobin 13.0 - 17.0 g/dL 56.213.9  Hematocrit 13.039.0 - 52.0 % 40.9  Platelets 150 - 400 K/uL 241   BMP Latest Ref Rng & Units 09/25/2017  Glucose 65 - 99 mg/dL 865(H154(H)  BUN 6 - 20 mg/dL 15  Creatinine 8.460.61 - 9.621.24 mg/dL 9.520.86  Sodium 841135 - 324145 mmol/L 137  Potassium 3.5 - 5.1 mmol/L 4.3  Chloride 101 - 111 mmol/L 103  CO2 22 - 32 mmol/L 22  Calcium 8.9 - 10.3 mg/dL 9.7   Intake/Output      04/23 0701 - 04/24 0700 04/24 0701 - 04/25 0700   P.O. 240    I.V. (mL/kg) 1000 (8.5)    IV Piggyback 210    Total Intake(mL/kg) 1450 (12.4)    Urine (mL/kg/hr) 700 (0.2)    Blood 200    Total Output 900    Net +550            Physical Exam: General: NAD.  Sitting upright in chair.  Sleepy, calm, conversant. Resp: No increased wob Cardio: regular rate and rhythm ABD soft Neurologically intact MSK Tolerated range of motion of the hip joint. Neurovascularly intact Sensation intact distally Feet warm Dorsiflexion/Plantar flexion intact Incision: dressing C/D/I   Assessment: 1 Day Post-Op  S/P Procedure(s) (LRB): TOTAL HIP ARTHROPLASTY ANTERIOR APPROACH (Right) by Dr. Jewel Baizeimothy D. Murphy on 10/06/2017  Principal Problem:   Primary osteoarthritis of right hip Active Problems:   Diabetes mellitus without complication (HCC)   Primary osteoarthritis of hip   Primary osteoarthritis, status post total hip arthroplasty Doing well postop day  1. Difficult early pain control-has a history of back pain and believes this has been aggravated.  Pain much better this morning. Eating, drinking, and voiding. Early mobilization with therapy  Plan: Advance diet Up with therapy D/C IV fluids when tolerating good p.o. Incentive Spirometry Elevate and Apply ice   Weight Bearing: Weight Bearing as Tolerated (WBAT) RLE Dressings: Maintain Mepilex.   VTE prophylaxis: Aspirin, SCDs, ambulation Dispo: Home later today pending therapy evaluations.   Jonathon KernHenry Calvin Martensen III, PA-C 10/07/2017, 7:47 AM

## 2017-10-07 NOTE — Progress Notes (Signed)
Physical Therapy Treatment and Discharge Patient Details Name: Jonathon Doyle MRN: [id number] DOB: Dec 31, 1960 Today's Date: 10/07/2017    History of Present Illness Pt is a 57 y/o male s/p elective R THA, direct anterior approach. PMH includes DM and HTN.     PT Comments    .Patient progressing well with therapy this AM. Now supervision for all mobility. Wife and pt report confidence in safe return home after stair training today. Reviewed therex. Pt and family educated on safety considerations for home and have no further questions or concerns at this time. Pt has met all functional goals and will benefit from skilled home health PT when medically cleared for d/c.      Follow Up Recommendations  Follow surgeon's recommendation for DC plan and follow-up therapies;Supervision for mobility/OOB     Equipment Recommendations  Rolling walker with 5" wheels;3in1 (PT)    Recommendations for Other Services       Precautions / Restrictions Precautions Precautions: None Precaution Comments: Ther ex deferred as pt wanting to eat. Verbally reviewed ther ex with pt and pt's wife.  Restrictions Weight Bearing Restrictions: Yes RLE Weight Bearing: Weight bearing as tolerated    Mobility  Bed Mobility               General bed mobility comments: OOB at entry  Transfers Overall transfer level: Needs assistance Equipment used: Rolling walker (2 wheeled) Transfers: Sit to/from Stand Sit to Stand: Supervision         General transfer comment: Supervision, cues for hand placement on RW  Ambulation/Gait Ambulation/Gait assistance: Supervision Ambulation Distance (Feet): 100 Feet Assistive device: Rolling walker (2 wheeled) Gait Pattern/deviations: Step-to pattern;Decreased step length - right;Decreased step length - left;Decreased weight shift to right;Antalgic Gait velocity: decreased   General Gait Details: slow and painful, progressed to supervision cues for step length     Stairs Stairs: Yes Stairs assistance: Min guard;Supervision Stair Management: Two rails;Forwards;Step to pattern Number of Stairs: 6 General stair comments: cues for sequencing. educated wife on proper guarding patient progresed to supervision.    Wheelchair Mobility    Modified Rankin (Stroke Patients Only)       Balance Overall balance assessment: Needs assistance Sitting-balance support: No upper extremity supported;Feet supported Sitting balance-Leahy Scale: Good     Standing balance support: Bilateral upper extremity supported;During functional activity Standing balance-Leahy Scale: Poor Standing balance comment: Reliant on BUE support.                             Cognition Arousal/Alertness: Awake/alert Behavior During Therapy: WFL for tasks assessed/performed Overall Cognitive Status: Within Functional Limits for tasks assessed                                        Exercises Total Joint Exercises Hip ABduction/ADduction: 10 reps Knee Flexion: 10 reps Marching in Standing: 10 reps Standing Hip Extension: 10 reps    General Comments        Pertinent Vitals/Pain Pain Assessment: Faces Pain Score: 6  Pain Location: R hip  Pain Descriptors / Indicators: Aching;Operative site guarding Pain Intervention(s): Limited activity within patient's tolerance;Monitored during session;Premedicated before session;Repositioned    Home Living                      Prior Function  PT Goals (current goals can now be found in the care plan section) Acute Rehab PT Goals PT Goal Formulation: With patient Time For Goal Achievement: 10/20/17 Potential to Achieve Goals: Good Progress towards PT goals: Progressing toward goals    Frequency           PT Plan Current plan remains appropriate    Co-evaluation              AM-PAC PT "6 Clicks" Daily Activity  Outcome Measure  Difficulty turning over in bed  (including adjusting bedclothes, sheets and blankets)?: A Little Difficulty moving from lying on back to sitting on the side of the bed? : A Little Difficulty sitting down on and standing up from a chair with arms (e.g., wheelchair, bedside commode, etc,.)?: A Little Help needed moving to and from a bed to chair (including a wheelchair)?: A Little Help needed walking in hospital room?: A Little Help needed climbing 3-5 steps with a railing? : A Little 6 Click Score: 18    End of Session Equipment Utilized During Treatment: Gait belt Activity Tolerance: Patient tolerated treatment well Patient left: in chair;with call bell/phone within reach;with family/visitor present Nurse Communication: Mobility status PT Visit Diagnosis: Other abnormalities of gait and mobility (R26.89);Pain Pain - Right/Left: Right Pain - part of body: Hip     Time: 0911-0951 PT Time Calculation (min) (ACUTE ONLY): 40 min  Charges:  $Gait Training: 8-22 mins $Therapeutic Exercise: 8-22 mins                    G Codes:       Reinaldo Berber, PT, DPT Acute Rehab Services Pager: [phone]     Reinaldo Berber 10/07/2017, 10:01 AM

## 2017-10-07 NOTE — Care Management Note (Signed)
Case Management Note  Patient Details  Name: Jonathon Doyle MRN: [id number] Date of Birth: Nov 22, 1960  Subjective/Objective:  57 yr old gentleman s/p right total hip arthroplasty.                  Action/Plan: Case manager spoke with patient and his wife concerning discharge plan and DME. Choice for Home Health Agency was offered, referral was called to Dara Hoyeriffany Watson, [facility]. Patient will have family support at discharge.   Expected Discharge Date:  10/07/17               Expected Discharge Plan:  Home w Home Health Services  In-House Referral:  NA  Discharge planning Services  CM Consult  Post Acute Care Choice:  Durable Medical Equipment, Home Health Choice offered to:  Patient  DME Arranged:  3-N-1, Walker rolling DME Agency:  [facility]  HH Arranged:  PT HH [facility]  Status of Service:  Completed, signed off  If discussed at [employer] [facility] [employer] Meetings, dates discussed:    Additional Comments:  Durenda GuthrieBrady, Navada Osterhout Naomi, RN 10/07/2017, 11:08 AM

## 2017-10-10 DIAGNOSIS — I10 Essential (primary) hypertension: Secondary | ICD-10-CM | POA: Diagnosis not present

## 2017-10-10 DIAGNOSIS — Z471 Aftercare following joint replacement surgery: Secondary | ICD-10-CM | POA: Diagnosis not present

## 2017-10-10 DIAGNOSIS — E119 Type 2 diabetes mellitus without complications: Secondary | ICD-10-CM | POA: Diagnosis not present

## 2017-10-13 DIAGNOSIS — E119 Type 2 diabetes mellitus without complications: Secondary | ICD-10-CM | POA: Diagnosis not present

## 2017-10-13 DIAGNOSIS — I10 Essential (primary) hypertension: Secondary | ICD-10-CM | POA: Diagnosis not present

## 2017-10-13 DIAGNOSIS — Z471 Aftercare following joint replacement surgery: Secondary | ICD-10-CM | POA: Diagnosis not present

## 2017-10-14 DIAGNOSIS — E119 Type 2 diabetes mellitus without complications: Secondary | ICD-10-CM | POA: Diagnosis not present

## 2017-10-14 DIAGNOSIS — I10 Essential (primary) hypertension: Secondary | ICD-10-CM | POA: Diagnosis not present

## 2017-10-14 DIAGNOSIS — Z471 Aftercare following joint replacement surgery: Secondary | ICD-10-CM | POA: Diagnosis not present

## 2017-10-16 DIAGNOSIS — Z471 Aftercare following joint replacement surgery: Secondary | ICD-10-CM | POA: Diagnosis not present

## 2017-10-16 DIAGNOSIS — E119 Type 2 diabetes mellitus without complications: Secondary | ICD-10-CM | POA: Diagnosis not present

## 2017-10-16 DIAGNOSIS — I10 Essential (primary) hypertension: Secondary | ICD-10-CM | POA: Diagnosis not present

## 2017-10-19 DIAGNOSIS — Z471 Aftercare following joint replacement surgery: Secondary | ICD-10-CM | POA: Diagnosis not present

## 2017-10-19 DIAGNOSIS — E119 Type 2 diabetes mellitus without complications: Secondary | ICD-10-CM | POA: Diagnosis not present

## 2017-10-19 DIAGNOSIS — I10 Essential (primary) hypertension: Secondary | ICD-10-CM | POA: Diagnosis not present

## 2017-10-21 DIAGNOSIS — M1611 Unilateral primary osteoarthritis, right hip: Secondary | ICD-10-CM | POA: Diagnosis not present

## 2017-11-06 DIAGNOSIS — E782 Mixed hyperlipidemia: Secondary | ICD-10-CM | POA: Diagnosis not present

## 2017-11-06 DIAGNOSIS — I10 Essential (primary) hypertension: Secondary | ICD-10-CM | POA: Diagnosis not present

## 2017-11-06 DIAGNOSIS — E119 Type 2 diabetes mellitus without complications: Secondary | ICD-10-CM | POA: Diagnosis not present

## 2018-03-10 DIAGNOSIS — M25562 Pain in left knee: Secondary | ICD-10-CM | POA: Diagnosis not present

## 2018-03-10 DIAGNOSIS — M170 Bilateral primary osteoarthritis of knee: Secondary | ICD-10-CM | POA: Diagnosis not present

## 2018-03-10 DIAGNOSIS — M25561 Pain in right knee: Secondary | ICD-10-CM | POA: Diagnosis not present

## 2018-03-24 DIAGNOSIS — M1711 Unilateral primary osteoarthritis, right knee: Secondary | ICD-10-CM | POA: Diagnosis not present

## 2018-03-24 DIAGNOSIS — M25561 Pain in right knee: Secondary | ICD-10-CM | POA: Diagnosis not present

## 2018-03-29 DIAGNOSIS — M25562 Pain in left knee: Secondary | ICD-10-CM | POA: Diagnosis not present

## 2018-03-29 DIAGNOSIS — M1712 Unilateral primary osteoarthritis, left knee: Secondary | ICD-10-CM | POA: Diagnosis not present

## 2018-03-31 DIAGNOSIS — M1711 Unilateral primary osteoarthritis, right knee: Secondary | ICD-10-CM | POA: Diagnosis not present

## 2018-03-31 DIAGNOSIS — M25561 Pain in right knee: Secondary | ICD-10-CM | POA: Diagnosis not present

## 2018-04-05 DIAGNOSIS — M1712 Unilateral primary osteoarthritis, left knee: Secondary | ICD-10-CM | POA: Diagnosis not present

## 2018-04-05 DIAGNOSIS — M25562 Pain in left knee: Secondary | ICD-10-CM | POA: Diagnosis not present

## 2018-04-07 DIAGNOSIS — M25561 Pain in right knee: Secondary | ICD-10-CM | POA: Diagnosis not present

## 2018-04-07 DIAGNOSIS — M1711 Unilateral primary osteoarthritis, right knee: Secondary | ICD-10-CM | POA: Diagnosis not present

## 2018-04-12 DIAGNOSIS — M1712 Unilateral primary osteoarthritis, left knee: Secondary | ICD-10-CM | POA: Diagnosis not present

## 2018-04-12 DIAGNOSIS — M25562 Pain in left knee: Secondary | ICD-10-CM | POA: Diagnosis not present

## 2018-05-12 DIAGNOSIS — I10 Essential (primary) hypertension: Secondary | ICD-10-CM | POA: Diagnosis not present

## 2018-05-12 DIAGNOSIS — Z23 Encounter for immunization: Secondary | ICD-10-CM | POA: Diagnosis not present

## 2018-05-12 DIAGNOSIS — E782 Mixed hyperlipidemia: Secondary | ICD-10-CM | POA: Diagnosis not present

## 2018-05-12 DIAGNOSIS — E119 Type 2 diabetes mellitus without complications: Secondary | ICD-10-CM | POA: Diagnosis not present

## 2019-03-21 IMAGING — RF DG HIP (WITH PELVIS) OPERATIVE*R*
1 series · 2 of 2 positions shown · non-contrast
Comparison: 01/09/2017 hip films.

CLINICAL DATA: 57-year-old male post right hip replacement. Initial
encounter.

EXAM:
DG C-ARM 61-120 MIN; OPERATIVE RIGHT HIP WITH PELVIS
Fluoroscopic time: 17 seconds.

[Series 1: run · 2 of 2 slices shown]
[im 1/2]
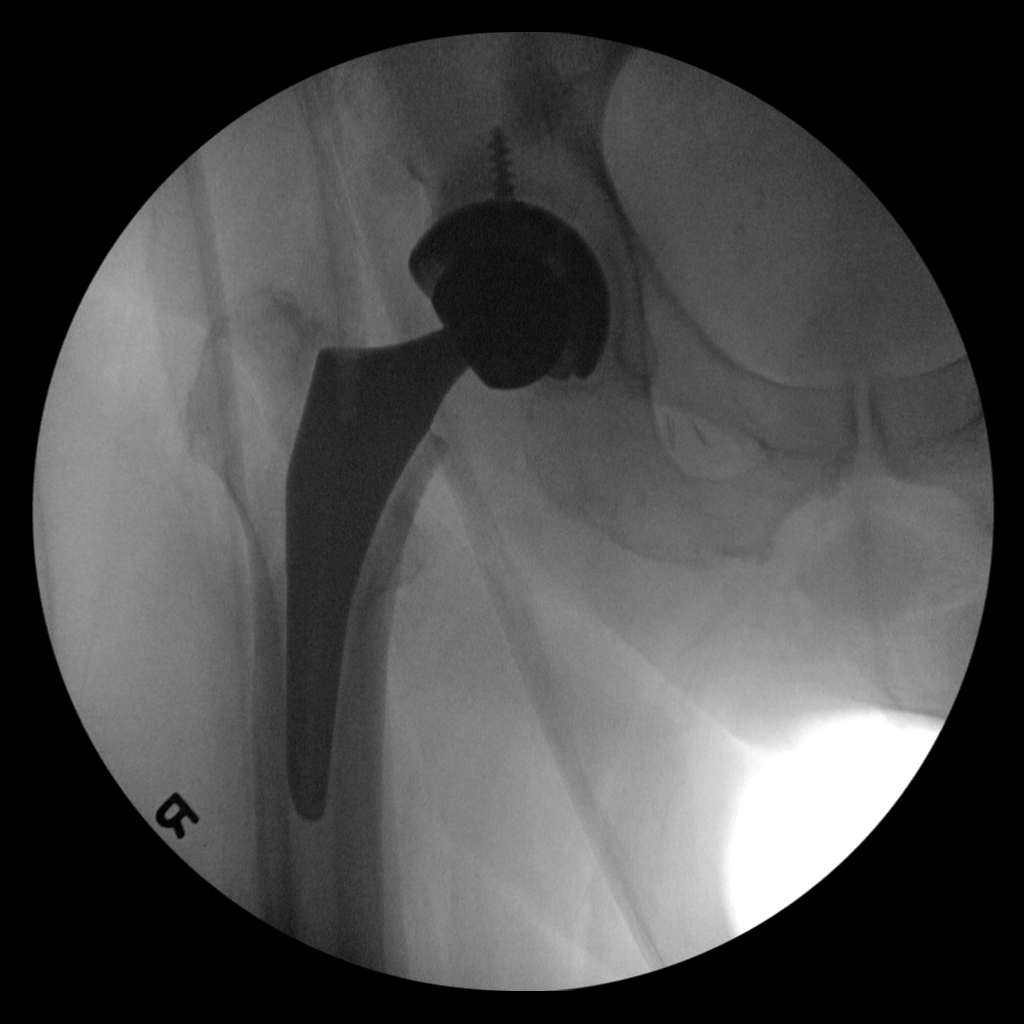
[im 2/2]
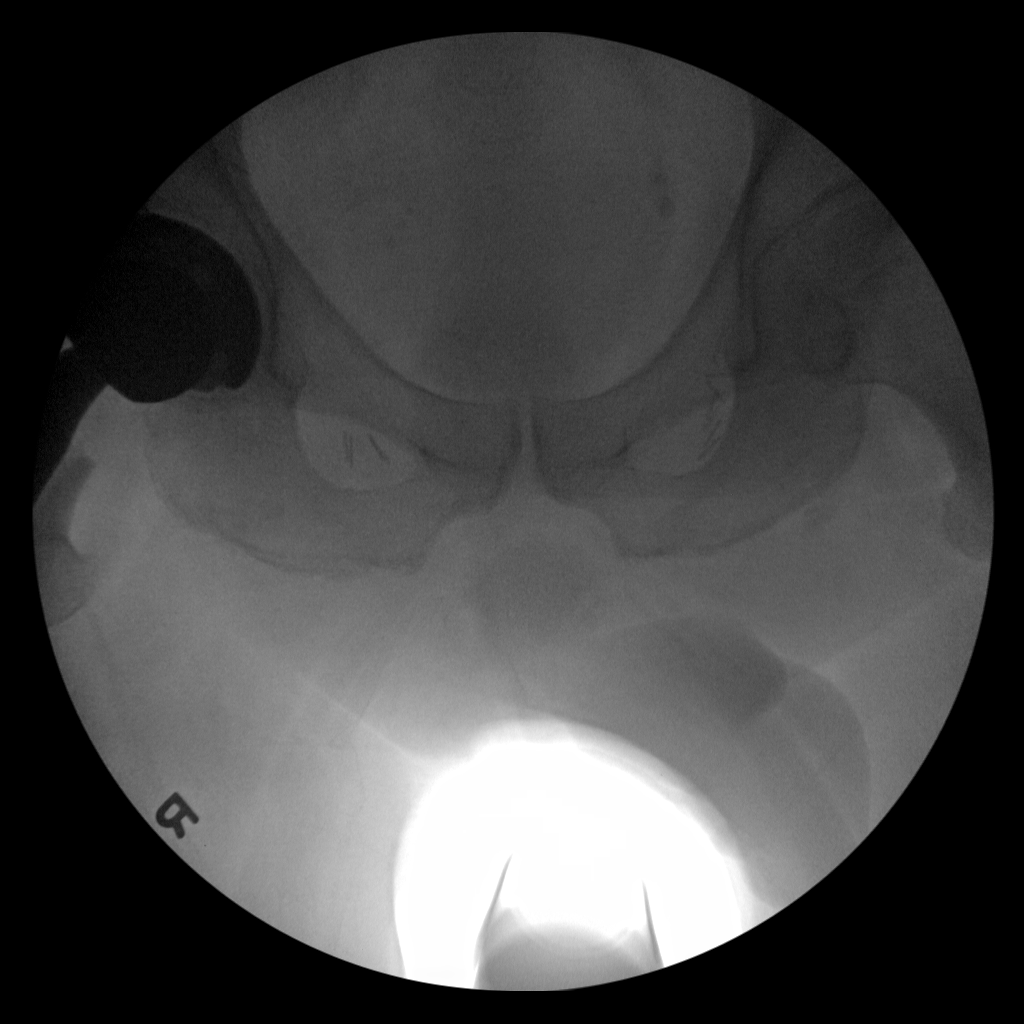

[2 of 2 positions shown; findings below may reference images not displayed]

FINDINGS: Two intraoperative C-arm views submitted for review after surgery.

Post total right hip replacement which appears in satisfactory
position without complication noted on frontal projection.

Sclerotic focus right ilium similar to prior exam.
IMPRESSION: Post total right hip replacement.

Sclerotic focus right ilium similar to prior exam.

## 2019-04-30 ENCOUNTER — Encounter (HOSPITAL_BASED_OUTPATIENT_CLINIC_OR_DEPARTMENT_OTHER): Payer: Self-pay | Admitting: Adult Health

## 2019-04-30 ENCOUNTER — Other Ambulatory Visit: Payer: Self-pay

## 2019-04-30 ENCOUNTER — Emergency Department (HOSPITAL_BASED_OUTPATIENT_CLINIC_OR_DEPARTMENT_OTHER)
Admission: EM | Admit: 2019-04-30 | Discharge: 2019-04-30 | Disposition: A | Discharge status: Home / Self Care | Payer: 59 | Attending: Emergency Medicine | Admitting: Emergency Medicine

## 2019-04-30 DIAGNOSIS — Y998 Other external cause status: Secondary | ICD-10-CM | POA: Insufficient documentation

## 2019-04-30 DIAGNOSIS — X102XXA Contact with fats and cooking oils, initial encounter: Secondary | ICD-10-CM | POA: Insufficient documentation

## 2019-04-30 DIAGNOSIS — Z79899 Other long term (current) drug therapy: Secondary | ICD-10-CM | POA: Insufficient documentation

## 2019-04-30 DIAGNOSIS — Y92000 Kitchen of unspecified non-institutional (private) residence as  the place of occurrence of the external cause: Secondary | ICD-10-CM | POA: Diagnosis not present

## 2019-04-30 DIAGNOSIS — T300 Burn of unspecified body region, unspecified degree: Secondary | ICD-10-CM

## 2019-04-30 DIAGNOSIS — I10 Essential (primary) hypertension: Secondary | ICD-10-CM | POA: Diagnosis not present

## 2019-04-30 DIAGNOSIS — Z7982 Long term (current) use of aspirin: Secondary | ICD-10-CM | POA: Insufficient documentation

## 2019-04-30 DIAGNOSIS — E119 Type 2 diabetes mellitus without complications: Secondary | ICD-10-CM | POA: Insufficient documentation

## 2019-04-30 DIAGNOSIS — Y93G3 Activity, cooking and baking: Secondary | ICD-10-CM | POA: Insufficient documentation

## 2019-04-30 DIAGNOSIS — T24292A Burn of second degree of multiple sites of left lower limb, except ankle and foot, initial encounter: Secondary | ICD-10-CM | POA: Diagnosis not present

## 2019-04-30 MED ORDER — SILVER SULFADIAZINE 1 % EX CREA
TOPICAL_CREAM | Freq: Once | CUTANEOUS | Status: AC
  Administered 2019-04-30: 23:00:00 via TOPICAL
  Filled 2019-04-30: qty 85

## 2019-04-30 MED ORDER — HYDROCODONE-ACETAMINOPHEN 5-325 MG PO TABS: 1.0000 | ORAL_TABLET | Freq: Four times a day (QID) | ORAL | 0 refills | Status: DC | PRN

## 2019-04-30 NOTE — Discharge Instructions (Signed)
As discussed, it is important that you keep your wound clean, monitor your condition carefully, and do not hesitate to return here. Otherwise, please be sure that your wound is evaluated again by another clinician in 2 days.
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
## 2020-08-27 ENCOUNTER — Ambulatory Visit: Payer: Self-pay | Admitting: Orthopedic Surgery

## 2020-08-27 NOTE — H&P (Deleted)
  The note originally documented on this encounter has been moved the the encounter in which it belongs.  

## 2020-08-27 NOTE — H&P (Signed)
Subjective:    Jonathon Doyle is a very pleasant 60 year old gentleman with a three-month history of bilateral upper extremity dysesthesias and increasing the right upper extremity weakness and difficulty maintaining his balance.After discussing that his findings were consistent with cervical myelopathy, discussing the natural progression of cervical myelopathy, treatment options, risks and benefits of surgery, the patient has elected to move forward with surgery. He is scheduled for ACDF C3-7 at [facility] on 09/05/20 with Dr. Shon Baton.   Patient Active Problem List   Diagnosis Date Noted  . Primary osteoarthritis of hip 10/06/2017  . Diabetes mellitus without complication (HCC) 09/16/2017  . Primary osteoarthritis of right hip 09/16/2017   Past Medical History:  Diagnosis Date  . Diabetes mellitus without complication (HCC)   . Hypertension   . Obesity     Past Surgical History:  Procedure Laterality Date  . COLONOSCOPY    . EYE SURGERY Bilateral 2014   cataract surgery  . TONSILLECTOMY    . TOTAL HIP ARTHROPLASTY Right 10/06/2017   Procedure: TOTAL HIP ARTHROPLASTY ANTERIOR APPROACH;  Surgeon: Sheral Apley, MD;  Location: [facility] OR;  Service: Orthopedics;  Laterality: Right;    Current Outpatient Medications  Medication Sig Dispense Refill Last Dose  . Cyanocobalamin (VITAMIN B-12) 3000 MCG SUBL Take 3,000 mcg by mouth daily.     Marland Kitchen gabapentin (NEURONTIN) 300 MG capsule Take 300 mg by mouth at bedtime.     Marland Kitchen lisinopril-hydrochlorothiazide (PRINZIDE,ZESTORETIC) 10-12.5 MG tablet Take 1 tablet by mouth in the morning.  0   . loratadine (CLARITIN) 10 MG tablet Take 10 mg by mouth daily as needed (ALLERGIES DURING SPRING MONTHS).     Marland Kitchen meloxicam (MOBIC) 15 MG tablet Take 15 mg by mouth in the morning.     . metFORMIN (GLUCOPHAGE) 850 MG tablet Take 850 mg by mouth daily with breakfast. with food  0   . pioglitazone (ACTOS) 15 MG tablet Take 15 mg by mouth in the morning.  0   . rosuvastatin  (CRESTOR) 10 MG tablet Take 10 mg by mouth in the morning.  0    No current facility-administered medications for this visit.   Allergies  Allergen Reactions  . Latex Itching, Swelling and Rash    SWELLING REACTION UNSPECIFIED     Social History   Tobacco Use  . Smoking status: Never Smoker  . Smokeless tobacco: Current User    Types: Chew  Substance Use Topics  . Alcohol use: Yes    Comment: occasionally    No family history on file.  Review of Systems Pertinent items are noted in HPI.  Objective:   Vitals:  Ht: 5 ft 8 in 08/27/2020 02:02 pm BP: 130/80 sitting R arm 08/27/2020 02:07 pm Pulse: 70 bpm 08/27/2020 02:06 pm  Clinical exam: Andersen is a pleasant individual, who appears younger than their stated age. He is alert and orientated 3. No shortness of breath, chest pain. Abdomen is soft and non-tender, negative loss of bowel and bladder control, no rebound tenderness.   Negative: skin lesions abrasions contusions  Peripheral pulses: 2+ radial artery pulses bilaterally. Compartment soft and nontender.  Gait pattern: Ataxic gait pattern. Difficulty with heel toe ambulation maintaining his balance. Assistive devices: None  Neuro: 4/5 right deltoid/biceps/wrist extensor strength. 4/5 left bicep strength. Remainder of the upper extremity is 5/5 strength. Patient has difficulty with fine motor control/dexterity. Brisk 3+ deep tendon reflexes symmetrical in the upper and lower extremity. No clonus. Positive Lhermitte sign, positive Hoffman test, positive Babinski test.  Negative inverted brachioradialis reflex  Musculoskeletal: Moderate neck pain with palpation and range of motion. No significant shoulder, elbow, wrist pain with isolated joint range of motion  Cervical x-rays taken today in the office (AP/lateral) were reviewed: Loss of normal cervical lordosis is noted multilevel degenerative disc disease with loss of normal disc space height and facet arthrosis C3-7. No  fracture seen.  Cervical MRI: completed on 08/11/20 was reviewed with the patient. It was completed at [facility]; I have independently reviewed the images as well as the radiology report. Slight degenerative anterolisthesis at C3-4. No abnormal marrow signal change. Severe central stenosis at C3-4 with positive cord signal changes. Multilevel degenerative disc disease C3-7. Mild spinal canal stenosis and severe by foraminal stenosis at C5-6 affecting both C6 nerve roots. Severe right C4-5 and C6-7 foraminal stenosis, both affecting the exiting C5 and C7 nerve root respectively. No fracture is seen no abnormal marrow signal changes.  Assessment:   [name] is a very pleasant 60 year old gentleman with a three-month history of bilateral upper extremity dysesthesias and increasing the right upper extremity weakness and difficulty maintaining his balance. 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 MRI confirms cord signal changes in multilevel foraminal stenosis. At this point time I have gone over his imaging studies with great detail and all of his questions were encouraged and addressed. I have talked to him about how the natural history of cervical myelopathy is 1 of progression and ultimately will cause lower extremity weakness and loss in the ability to ambulate. Conservative management is surgical intervention. The goal of surgery is not improvement with to maintain current status and prevent worsening of his symptoms. There is the opportunity for improvement but I stressed to him our primary goal is to prevent worsening of the myelopathic symptoms.  Plan:   Risks and benefits of surgery were discussed with the patient. These include: Infection, bleeding, death, stroke, paralysis, ongoing or worse pain, need for additional surgery, nonunion, leak of spinal fluid, adjacent segment degeneration requiring additional fusion surgery. Pseudoarthrosis  (nonunion)requiring supplemental posterior fixation. Throat pain, swallowing difficulties, hoarseness or change in voice. With respect to disc replacement: Additional risks include heterotopic ossification, inability to place the disc due to technical issues requiring bailout to a fusion procedure.  Patient has elected to move forward with surgery.  I reviewed the patient's medication list with him. He is not on any blood thinners. He is not using any aspirin products. He does use meloxicam which I have advised him to discontinue 7 days prior to surgery and hold for 6 weeks postop. Patient expressed understanding of this.  Patient saw his primary care provider on Friday, waiting for clearance form to be faxed.  We have also discussed the post-operative recovery period to include: bathing/showering restrictions, wound healing, activity (and driving) restrictions, medications/pain mangement.  We have also discussed post-operative redflags to include: signs and symptoms of postoperative infection, DVT/PE.  Patient scheduled to get aspen collar fitted on Tuesday.  Patient as well as his wife were at today's office visit. I did review discharge instructions with them and gave them a copy. All of their questions were invited and answered.  F/U: 2 weeks post-op

## 2020-09-04 ENCOUNTER — Ambulatory Visit (HOSPITAL_COMMUNITY)
Admission: RE | Admit: 2020-09-04 | Discharge: 2020-09-04 | Disposition: A | Discharge status: Home / Self Care | Payer: 59 | Source: Ambulatory Visit | Attending: Orthopedic Surgery | Admitting: Orthopedic Surgery

## 2020-09-04 ENCOUNTER — Encounter (HOSPITAL_COMMUNITY)
Admission: RE | Admit: 2020-09-04 | Discharge: 2020-09-04 | Disposition: A | Discharge status: Home / Self Care | Payer: 59 | Source: Ambulatory Visit | Attending: Orthopedic Surgery | Admitting: Orthopedic Surgery

## 2020-09-04 ENCOUNTER — Encounter (HOSPITAL_COMMUNITY): Payer: Self-pay

## 2020-09-04 ENCOUNTER — Ambulatory Visit: Payer: Self-pay | Admitting: Orthopedic Surgery

## 2020-09-04 ENCOUNTER — Other Ambulatory Visit: Payer: Self-pay

## 2020-09-04 DIAGNOSIS — Z01818 Encounter for other preprocedural examination: Secondary | ICD-10-CM

## 2020-09-04 DIAGNOSIS — I491 Atrial premature depolarization: Secondary | ICD-10-CM | POA: Insufficient documentation

## 2020-09-04 DIAGNOSIS — I4510 Unspecified right bundle-branch block: Secondary | ICD-10-CM | POA: Diagnosis not present

## 2020-09-04 DIAGNOSIS — Z20822 Contact with and (suspected) exposure to covid-19: Secondary | ICD-10-CM | POA: Insufficient documentation

## 2020-09-04 HISTORY — DX: Unspecified asthma, uncomplicated: J45.909

## 2020-09-04 HISTORY — DX: Unspecified osteoarthritis, unspecified site: M19.90

## 2020-09-04 HISTORY — DX: Gastro-esophageal reflux disease without esophagitis: K21.9

## 2020-09-04 LAB — BASIC METABOLIC PANEL
Anion gap: 6 (ref 5–15)
BUN: 10 mg/dL (ref 6–20)
CO2: 30 mmol/L (ref 22–32)
Calcium: 9.4 mg/dL (ref 8.9–10.3)
Chloride: 102 mmol/L (ref 98–111)
Creatinine, Ser: 0.91 mg/dL (ref 0.61–1.24)
GFR, Estimated: >60 mL/min (ref >60)
Glucose, Bld: 124 mg/dL — ABNORMAL HIGH (ref 70–99)
Potassium: 3.8 mmol/L (ref 3.5–5.1)
Sodium: 138 mmol/L (ref 135–145)

## 2020-09-04 LAB — CBC
HCT: 41 % (ref 39.0–52.0)
Hemoglobin: 13.6 g/dL (ref 13.0–17.0)
MCH: 29.7 pg (ref 26.0–34.0)
MCHC: 33.2 g/dL (ref 30.0–36.0)
MCV: 89.5 fL (ref 80.0–100.0)
Platelets: 210 10*3/uL (ref 150–400)
RBC: 4.58 MIL/uL (ref 4.22–5.81)
RDW: 13.5 % (ref 11.5–15.5)
WBC: 6.3 10*3/uL (ref 4.0–10.5)
nRBC: 0 % (ref 0.0–0.2)

## 2020-09-04 LAB — GLUCOSE, CAPILLARY: Glucose-Capillary: 125 mg/dL — ABNORMAL HIGH (ref 70–99)

## 2020-09-04 LAB — PROTIME-INR
INR: 1 (ref 0.8–1.2)
Prothrombin Time: 12.5 seconds (ref 11.4–15.2)

## 2020-09-04 LAB — URINALYSIS, COMPLETE (UACMP) WITH MICROSCOPIC
Bacteria, UA: NONE SEEN
Bilirubin Urine: NEGATIVE
Glucose, UA: NEGATIVE mg/dL
Ketones, ur: NEGATIVE mg/dL
Leukocytes,Ua: NEGATIVE
Nitrite: NEGATIVE
Protein, ur: NEGATIVE mg/dL
Specific Gravity, Urine: 1.009 (ref 1.005–1.030)
pH: 7 (ref 5.0–8.0)

## 2020-09-04 LAB — SARS CORONAVIRUS 2 (TAT 6-24 HRS): SARS Coronavirus 2: NEGATIVE

## 2020-09-04 LAB — HEMOGLOBIN A1C
Hgb A1c MFr Bld: 6.9 % — ABNORMAL HIGH (ref 4.8–5.6)
Mean Plasma Glucose: 151.33 mg/dL

## 2020-09-04 LAB — APTT: aPTT: 29 seconds (ref 24–36)

## 2020-09-04 LAB — SURGICAL PCR SCREEN
MRSA, PCR: NEGATIVE
Staphylococcus aureus: NEGATIVE

## 2020-09-04 NOTE — Progress Notes (Signed)
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 Patient verbalized understanding of self-quarantine instructions.  Anesthesia review:  YES, per MD orders Surgery tomorrow 09/05/2020, Fayrene Fearing, PA-c aware.  Patient denies shortness of breath, fever, cough and chest pain at PAT appointment  All instructions explained to the patient, with a verbal understanding of the material. Patient agrees to go over the instructions while at home for a better understanding. Patient also instructed to self quarantine after being tested for COVID-19. The opportunity to ask questions was provided.

## 2020-09-04 NOTE — Progress Notes (Signed)
Surgical Instructions   Your procedure is scheduled on Wednesday, March 23rd.  Report to [facility] Main Entrance "A" at 9:40 A.M., then check in with the Admitting office.  Call this number if you have problems the morning of surgery:  [phone]   If you have any questions prior to your surgery date call [phone]: Open Monday-Friday 8am-4pm   Remember:  Do not eat or drink after midnight the night before your surgery    Take these medicines the morning of surgery with A SIP OF WATER  rosuvastatin (CRESTOR)  If needed: loratadine (CLARITIN)   As of today, STOP taking meloxicam (MOBIC), any Aspirin (unless otherwise instructed by your surgeon) Aleve, Naproxen, Ibuprofen, Motrin, Advil, Goody's, BC's, all herbal medications, fish oil, and all vitamins.           WHAT DO I DO ABOUT MY DIABETES MEDICATION?  The morning of surgery Do NOT take metFORMIN (GLUCOPHAGE) or pioglitazone (ACTOS)     HOW TO MANAGE YOUR DIABETES BEFORE AND AFTER SURGERY  Why is it important to control my blood sugar before and after surgery? . Improving blood sugar levels before and after surgery helps healing and can limit problems. . A way of improving blood sugar control is eating a healthy diet by: o  Eating less sugar and carbohydrates o  Increasing activity/exercise o  Talking with your doctor about reaching your blood sugar goals . High blood sugars (greater than 180 mg/dL) can raise your risk of infections and slow your recovery, so you will need to focus on controlling your diabetes during the weeks before surgery. . Make sure that the doctor who takes care of your diabetes knows about your planned surgery including the date and location.  How do I manage my blood sugar before surgery? . Check your blood sugar at least 4 times a day, starting 2 days before surgery, to make sure that the level is not too high or low. . Check your blood sugar the morning of your surgery when you wake up and  every 2 hours until you get to the Short Stay unit. o If your blood sugar is less than 70 mg/dL, you will need to treat for low blood sugar: - Do not take insulin. - Treat a low blood sugar (less than 70 mg/dL) with  cup of clear juice (cranberry or apple), 4 glucose tablets, OR glucose gel. - Recheck blood sugar in 15 minutes after treatment (to make sure it is greater than 70 mg/dL). If your blood sugar is not greater than 70 mg/dL on recheck, call [phone] for further instructions. . Report your blood sugar to the short stay nurse when you get to Short Stay.  . If you are admitted to the hospital after surgery: o Your blood sugar will be checked by the staff and you will probably be given insulin after surgery (instead of oral diabetes medicines) to make sure you have good blood sugar levels. o The goal for blood sugar control after surgery is 80-180 mg/dL.             Do not wear jewelry            Do not wear lotions, powders,colognes, or deodorant.            Men may shave face and neck.            Do not bring valuables to the hospital.            [facility]  is not responsible for any belongings or valuables.  Do NOT Smoke (Tobacco/Vaping) or drink Alcohol 24 hours prior to your procedure If you use a CPAP at night, you may bring all equipment for your overnight stay.   Contacts, glasses, dentures or bridgework may not be worn into surgery, please bring cases for these belongings   For patients admitted to the hospital, discharge time will be determined by your treatment team.   Patients discharged the day of surgery will not be allowed to drive home, and someone needs to stay with them for 24 hours.  Special instructions:   [place]- Preparing For Surgery  Before surgery, you can play an important role. Because skin is not sterile, your skin needs to be as free of germs as possible. You can reduce the number of germs on your skin by washing with CHG (chlorahexidine  gluconate) Soap before surgery.  CHG is an antiseptic cleaner which kills germs and bonds with the skin to continue killing germs even after washing.    Oral Hygiene is also important to reduce your risk of infection.  Remember - BRUSH YOUR TEETH THE MORNING OF SURGERY WITH YOUR REGULAR TOOTHPASTE  Please do not use if you have an allergy to CHG or antibacterial soaps. If your skin becomes reddened/irritated stop using the CHG.  Do not shave (including legs and underarms) for at least 48 hours prior to first CHG shower. It is OK to shave your face.  Please follow these instructions carefully.   1. Shower the NIGHT BEFORE SURGERY and the MORNING OF SURGERY  2. If you chose to wash your hair, wash your hair first as usual with your normal shampoo. After you shampoo, rinse your hair and body thoroughly to remove the shampoo.  3. Wash Face and genitals (private parts) with your normal soap.   4. Use CHG Soap as you would any other liquid soap. You can apply CHG directly to the skin and wash gently with a scrungie or a clean washcloth.   5. Apply the CHG Soap to your body ONLY FROM THE NECK DOWN.  Do not use on open wounds or open sores. Avoid contact with your eyes, ears, mouth and genitals (private parts). Wash Face and genitals (private parts)  with your normal soap.   6. Wash thoroughly, paying special attention to the area where your surgery will be performed.  7. Thoroughly rinse your body with warm water from the neck down.  8. DO NOT shower/wash with your normal soap after using and rinsing off the CHG Soap.  9. Pat yourself dry with a CLEAN TOWEL.  10. Wear CLEAN PAJAMAS to bed the night before surgery  11. Place CLEAN SHEETS on your bed the night before your surgery  12. DO NOT SLEEP WITH PETS.  Day of Surgery: Shower with CHG soap Wear Clean/Comfortable clothing the morning of surgery Do not apply any deodorants/lotions.   Remember to brush your teeth WITH YOUR REGULAR  TOOTHPASTE.   Please read over the following fact sheets that you were given.

## 2020-09-04 NOTE — Progress Notes (Signed)
   09/04/20 1432  OBSTRUCTIVE SLEEP APNEA  Have you ever been diagnosed with sleep apnea through a sleep study? No  Do you snore loudly (loud enough to be heard through closed doors)?  1  Do you often feel tired, fatigued, or sleepy during the daytime (such as falling asleep during driving or talking to someone)? 0  Has anyone observed you stop breathing during your sleep? 0  Do you have, or are you being treated for high blood pressure? 1  BMI more than 35 kg/m2? 1  Age > 50 (1-yes) 1  Neck circumference greater than:Male 16 inches or larger, Male 17inches or larger? 1  Male Gender (Yes=1) 1  Obstructive Sleep Apnea Score 6  Score 5 or greater  Results sent to PCP

## 2020-09-05 ENCOUNTER — Ambulatory Visit (HOSPITAL_COMMUNITY)
Admission: RE | Disposition: A | Discharge status: Home / Self Care | Payer: Self-pay | Source: Ambulatory Visit | Attending: Orthopedic Surgery

## 2020-09-05 ENCOUNTER — Encounter (HOSPITAL_COMMUNITY): Payer: Self-pay | Admitting: Orthopedic Surgery

## 2020-09-05 ENCOUNTER — Observation Stay (HOSPITAL_COMMUNITY)
Admission: RE | Admit: 2020-09-05 | Discharge: 2020-09-06 | Disposition: A | Discharge status: Home / Self Care | Payer: 59 | Source: Ambulatory Visit | Attending: Orthopedic Surgery | Admitting: Orthopedic Surgery

## 2020-09-05 ENCOUNTER — Ambulatory Visit (HOSPITAL_COMMUNITY): Payer: 59 | Admitting: Physician Assistant

## 2020-09-05 ENCOUNTER — Ambulatory Visit (HOSPITAL_COMMUNITY): Payer: 59

## 2020-09-05 ENCOUNTER — Other Ambulatory Visit: Payer: Self-pay

## 2020-09-05 DIAGNOSIS — I10 Essential (primary) hypertension: Secondary | ICD-10-CM | POA: Insufficient documentation

## 2020-09-05 DIAGNOSIS — Z419 Encounter for procedure for purposes other than remedying health state, unspecified: Secondary | ICD-10-CM

## 2020-09-05 DIAGNOSIS — Z79899 Other long term (current) drug therapy: Secondary | ICD-10-CM | POA: Insufficient documentation

## 2020-09-05 DIAGNOSIS — F17220 Nicotine dependence, chewing tobacco, uncomplicated: Secondary | ICD-10-CM | POA: Insufficient documentation

## 2020-09-05 DIAGNOSIS — M4802 Spinal stenosis, cervical region: Secondary | ICD-10-CM | POA: Diagnosis not present

## 2020-09-05 DIAGNOSIS — Z91040 Latex allergy status: Secondary | ICD-10-CM | POA: Insufficient documentation

## 2020-09-05 DIAGNOSIS — Z7984 Long term (current) use of oral hypoglycemic drugs: Secondary | ICD-10-CM | POA: Diagnosis not present

## 2020-09-05 DIAGNOSIS — Z96641 Presence of right artificial hip joint: Secondary | ICD-10-CM | POA: Diagnosis not present

## 2020-09-05 DIAGNOSIS — E119 Type 2 diabetes mellitus without complications: Secondary | ICD-10-CM | POA: Diagnosis not present

## 2020-09-05 DIAGNOSIS — G959 Disease of spinal cord, unspecified: Secondary | ICD-10-CM | POA: Diagnosis present

## 2020-09-05 DIAGNOSIS — J45909 Unspecified asthma, uncomplicated: Secondary | ICD-10-CM | POA: Insufficient documentation

## 2020-09-05 DIAGNOSIS — M4712 Other spondylosis with myelopathy, cervical region: Principal | ICD-10-CM | POA: Insufficient documentation

## 2020-09-05 HISTORY — PX: ANTERIOR CERVICAL DECOMPRESSION/DISCECTOMY FUSION 4 LEVELS: SHX5556

## 2020-09-05 LAB — GLUCOSE, CAPILLARY
Glucose-Capillary: 123 mg/dL — ABNORMAL HIGH (ref 70–99)
Glucose-Capillary: 198 mg/dL — ABNORMAL HIGH (ref 70–99)

## 2020-09-05 LAB — TYPE AND SCREEN
ABO/RH(D): O POS
Antibody Screen: NEGATIVE

## 2020-09-05 LAB — ABO/RH: ABO/RH(D): O POS

## 2020-09-05 SURGERY — ANTERIOR CERVICAL DECOMPRESSION/DISCECTOMY FUSION 4 LEVELS
Anesthesia: General | Site: Neck

## 2020-09-05 MED ORDER — SUCCINYLCHOLINE CHLORIDE 20 MG/ML IJ SOLN
INTRAMUSCULAR | Status: DC | PRN
  Administered 2020-09-05: 150 mg via INTRAVENOUS

## 2020-09-05 MED ORDER — THROMBIN 20000 UNITS EX SOLR
CUTANEOUS | Status: DC | PRN
  Administered 2020-09-05: 20 mL via TOPICAL

## 2020-09-05 MED ORDER — POLYETHYLENE GLYCOL 3350 17 G PO PACK: 17.0000 g | PACK | Freq: Every day | ORAL | Status: DC | PRN

## 2020-09-05 MED ORDER — OXYCODONE HCL 5 MG/5ML PO SOLN: 5.0000 mg | Freq: Once | ORAL | Status: DC | PRN

## 2020-09-05 MED ORDER — DEXAMETHASONE SODIUM PHOSPHATE 10 MG/ML IJ SOLN
INTRAMUSCULAR | Status: DC | PRN
  Administered 2020-09-05: 10 mg via INTRAVENOUS

## 2020-09-05 MED ORDER — ACETAMINOPHEN 10 MG/ML IV SOLN
INTRAVENOUS | Status: DC | PRN
  Administered 2020-09-05: 1000 mg via INTRAVENOUS

## 2020-09-05 MED ORDER — PHENYLEPHRINE HCL-NACL 10-0.9 MG/250ML-% IV SOLN
INTRAVENOUS | Status: DC | PRN
  Administered 2020-09-05: 60 ug/min via INTRAVENOUS

## 2020-09-05 MED ORDER — INSULIN ASPART 100 UNIT/ML ~~LOC~~ SOLN
0.0000 [IU] | Freq: Three times a day (TID) | SUBCUTANEOUS | Status: DC
  Administered 2020-09-06: 3 [IU] via SUBCUTANEOUS

## 2020-09-05 MED ORDER — 0.9 % SODIUM CHLORIDE (POUR BTL) OPTIME
TOPICAL | Status: DC | PRN
  Administered 2020-09-05 (×2): 1000 mL

## 2020-09-05 MED ORDER — HYDROMORPHONE HCL 1 MG/ML IJ SOLN
0.2500 mg | INTRAMUSCULAR | Status: DC | PRN
  Administered 2020-09-05 (×2): 0.25 mg via INTRAVENOUS

## 2020-09-05 MED ORDER — KETAMINE HCL 50 MG/5ML IJ SOSY
PREFILLED_SYRINGE | INTRAMUSCULAR | Status: AC
  Filled 2020-09-05: qty 5

## 2020-09-05 MED ORDER — BUPIVACAINE HCL (PF) 0.25 % IJ SOLN
INTRAMUSCULAR | Status: AC
  Filled 2020-09-05: qty 30

## 2020-09-05 MED ORDER — LISINOPRIL 10 MG PO TABS: 10.0000 mg | ORAL_TABLET | Freq: Every day | ORAL | Status: DC

## 2020-09-05 MED ORDER — HYDROCHLOROTHIAZIDE 12.5 MG PO CAPS: 12.5000 mg | ORAL_CAPSULE | Freq: Every day | ORAL | Status: DC

## 2020-09-05 MED ORDER — HYDROMORPHONE HCL 1 MG/ML IJ SOLN
INTRAMUSCULAR | Status: AC
  Filled 2020-09-05: qty 0.5

## 2020-09-05 MED ORDER — METHOCARBAMOL 500 MG PO TABS
500.0000 mg | ORAL_TABLET | Freq: Three times a day (TID) | ORAL | 0 refills | Status: AC | PRN
Start: 2020-09-05 — End: 2020-09-10

## 2020-09-05 MED ORDER — ACETAMINOPHEN 650 MG RE SUPP
650.0000 mg | RECTAL | Status: DC | PRN
Start: 2020-09-05 — End: 2020-09-06

## 2020-09-05 MED ORDER — LIDOCAINE 2% (20 MG/ML) 5 ML SYRINGE
INTRAMUSCULAR | Status: DC | PRN
  Administered 2020-09-05: 60 mg via INTRAVENOUS

## 2020-09-05 MED ORDER — CEFAZOLIN SODIUM-DEXTROSE 2-4 GM/100ML-% IV SOLN: 2.0000 g | INTRAVENOUS | Status: DC

## 2020-09-05 MED ORDER — OXYCODONE-ACETAMINOPHEN 10-325 MG PO TABS: 1.0000 | ORAL_TABLET | Freq: Four times a day (QID) | ORAL | 0 refills | Status: AC | PRN

## 2020-09-05 MED ORDER — CEFAZOLIN SODIUM 1 G IJ SOLR
INTRAMUSCULAR | Status: AC
  Filled 2020-09-05: qty 30

## 2020-09-05 MED ORDER — MIDAZOLAM HCL 2 MG/2ML IJ SOLN
INTRAMUSCULAR | Status: AC
  Filled 2020-09-05: qty 2

## 2020-09-05 MED ORDER — PROPOFOL 1000 MG/100ML IV EMUL
INTRAVENOUS | Status: AC
  Filled 2020-09-05: qty 100

## 2020-09-05 MED ORDER — LISINOPRIL-HYDROCHLOROTHIAZIDE 10-12.5 MG PO TABS: 1.0000 | ORAL_TABLET | Freq: Every morning | ORAL | Status: DC

## 2020-09-05 MED ORDER — DEXAMETHASONE SODIUM PHOSPHATE 4 MG/ML IJ SOLN
4.0000 mg | Freq: Four times a day (QID) | INTRAMUSCULAR | Status: DC
  Administered 2020-09-05 – 2020-09-06 (×2): 4 mg via INTRAVENOUS
  Filled 2020-09-05 (×2): qty 1

## 2020-09-05 MED ORDER — KETAMINE HCL 10 MG/ML IJ SOLN
INTRAMUSCULAR | Status: DC | PRN
  Administered 2020-09-05: 50 mg via INTRAVENOUS

## 2020-09-05 MED ORDER — HEMOSTATIC AGENTS (NO CHARGE) OPTIME
TOPICAL | Status: DC | PRN
  Administered 2020-09-05 (×2): 1 via TOPICAL

## 2020-09-05 MED ORDER — CHLORHEXIDINE GLUCONATE 0.12 % MT SOLN: 15.0000 mL | Freq: Once | OROMUCOSAL | Status: AC

## 2020-09-05 MED ORDER — LACTATED RINGERS IV SOLN: INTRAVENOUS | Status: DC | PRN

## 2020-09-05 MED ORDER — SODIUM CHLORIDE 0.9 % IV SOLN
0.1250 mg/kg/h | INTRAVENOUS | Status: AC
  Administered 2020-09-05: .25 mg/kg/h via INTRAVENOUS
  Filled 2020-09-05: qty 5

## 2020-09-05 MED ORDER — SODIUM CHLORIDE 0.9% FLUSH: 3.0000 mL | INTRAVENOUS | Status: DC | PRN

## 2020-09-05 MED ORDER — ONDANSETRON HCL 4 MG/2ML IJ SOLN: 4.0000 mg | Freq: Four times a day (QID) | INTRAMUSCULAR | Status: DC | PRN

## 2020-09-05 MED ORDER — FENTANYL CITRATE (PF) 250 MCG/5ML IJ SOLN
INTRAMUSCULAR | Status: AC
  Filled 2020-09-05: qty 5

## 2020-09-05 MED ORDER — ONDANSETRON HCL 4 MG PO TABS: 4.0000 mg | ORAL_TABLET | Freq: Three times a day (TID) | ORAL | 0 refills | Status: DC | PRN

## 2020-09-05 MED ORDER — ROCURONIUM BROMIDE 10 MG/ML (PF) SYRINGE
PREFILLED_SYRINGE | INTRAVENOUS | Status: AC
  Filled 2020-09-05: qty 10

## 2020-09-05 MED ORDER — LACTATED RINGERS IV SOLN: INTRAVENOUS | Status: DC

## 2020-09-05 MED ORDER — MENTHOL 3 MG MT LOZG: 1.0000 | LOZENGE | OROMUCOSAL | Status: DC | PRN

## 2020-09-05 MED ORDER — ACETAMINOPHEN 10 MG/ML IV SOLN
INTRAVENOUS | Status: AC
  Filled 2020-09-05: qty 100

## 2020-09-05 MED ORDER — EPINEPHRINE PF 1 MG/ML IJ SOLN
INTRAMUSCULAR | Status: DC | PRN
  Administered 2020-09-05: .15 mL

## 2020-09-05 MED ORDER — PHENYLEPHRINE HCL-NACL 10-0.9 MG/250ML-% IV SOLN
INTRAVENOUS | Status: AC
  Filled 2020-09-05: qty 250

## 2020-09-05 MED ORDER — PROPOFOL 10 MG/ML IV BOLUS
INTRAVENOUS | Status: AC
  Filled 2020-09-05: qty 40

## 2020-09-05 MED ORDER — ORAL CARE MOUTH RINSE: 15.0000 mL | Freq: Once | OROMUCOSAL | Status: AC

## 2020-09-05 MED ORDER — PROPOFOL 10 MG/ML IV BOLUS
INTRAVENOUS | Status: DC | PRN
  Administered 2020-09-05: 20 mg via INTRAVENOUS
  Administered 2020-09-05: 100 mg via INTRAVENOUS
  Administered 2020-09-05: 50 mg via INTRAVENOUS

## 2020-09-05 MED ORDER — METFORMIN HCL 850 MG PO TABS
850.0000 mg | ORAL_TABLET | Freq: Every day | ORAL | Status: DC
  Administered 2020-09-06: 850 mg via ORAL
  Filled 2020-09-05: qty 1

## 2020-09-05 MED ORDER — PIOGLITAZONE HCL 15 MG PO TABS
15.0000 mg | ORAL_TABLET | ORAL | Status: DC
  Administered 2020-09-06: 15 mg via ORAL
  Filled 2020-09-05: qty 1

## 2020-09-05 MED ORDER — METHOCARBAMOL 1000 MG/10ML IJ SOLN
500.0000 mg | Freq: Four times a day (QID) | INTRAVENOUS | Status: DC | PRN
  Filled 2020-09-05: qty 5

## 2020-09-05 MED ORDER — ONDANSETRON HCL 4 MG/2ML IJ SOLN
INTRAMUSCULAR | Status: DC | PRN
  Administered 2020-09-05: 4 mg via INTRAVENOUS

## 2020-09-05 MED ORDER — FENTANYL CITRATE (PF) 100 MCG/2ML IJ SOLN
INTRAMUSCULAR | Status: DC | PRN
  Administered 2020-09-05: 150 ug via INTRAVENOUS
  Administered 2020-09-05: 100 ug via INTRAVENOUS

## 2020-09-05 MED ORDER — FLEET ENEMA 7-19 GM/118ML RE ENEM: 1.0000 | ENEMA | Freq: Once | RECTAL | Status: DC | PRN

## 2020-09-05 MED ORDER — GABAPENTIN 300 MG PO CAPS
300.0000 mg | ORAL_CAPSULE | Freq: Every day | ORAL | Status: DC
  Administered 2020-09-05: 300 mg via ORAL
  Filled 2020-09-05: qty 1

## 2020-09-05 MED ORDER — ACETAMINOPHEN 325 MG PO TABS
650.0000 mg | ORAL_TABLET | ORAL | Status: DC | PRN
  Administered 2020-09-05 – 2020-09-06 (×3): 650 mg via ORAL
  Filled 2020-09-05 (×3): qty 2

## 2020-09-05 MED ORDER — SODIUM CHLORIDE 0.9% FLUSH: 3.0000 mL | Freq: Two times a day (BID) | INTRAVENOUS | Status: DC

## 2020-09-05 MED ORDER — DEXAMETHASONE 4 MG PO TABS
4.0000 mg | ORAL_TABLET | Freq: Four times a day (QID) | ORAL | Status: DC
  Administered 2020-09-05: 4 mg via ORAL
  Filled 2020-09-05: qty 1

## 2020-09-05 MED ORDER — PHENOL 1.4 % MT LIQD: 1.0000 | OROMUCOSAL | Status: DC | PRN

## 2020-09-05 MED ORDER — THROMBIN (RECOMBINANT) 20000 UNITS EX SOLR
CUTANEOUS | Status: AC
  Filled 2020-09-05: qty 20000

## 2020-09-05 MED ORDER — CEFAZOLIN SODIUM-DEXTROSE 2-4 GM/100ML-% IV SOLN
INTRAVENOUS | Status: AC
  Filled 2020-09-05: qty 100

## 2020-09-05 MED ORDER — TRANEXAMIC ACID-NACL 1000-0.7 MG/100ML-% IV SOLN
INTRAVENOUS | Status: AC
  Filled 2020-09-05: qty 100

## 2020-09-05 MED ORDER — TRANEXAMIC ACID-NACL 1000-0.7 MG/100ML-% IV SOLN
INTRAVENOUS | Status: DC | PRN
  Administered 2020-09-05: 1000 mg via INTRAVENOUS

## 2020-09-05 MED ORDER — MIDAZOLAM HCL 5 MG/5ML IJ SOLN
INTRAMUSCULAR | Status: DC | PRN
  Administered 2020-09-05: 2 mg via INTRAVENOUS

## 2020-09-05 MED ORDER — HYDROMORPHONE HCL 1 MG/ML IJ SOLN
INTRAMUSCULAR | Status: DC | PRN
  Administered 2020-09-05: .5 mg via INTRAVENOUS

## 2020-09-05 MED ORDER — CHLORHEXIDINE GLUCONATE 0.12 % MT SOLN
OROMUCOSAL | Status: AC
  Administered 2020-09-05: 15 mL via OROMUCOSAL
  Filled 2020-09-05: qty 15

## 2020-09-05 MED ORDER — METHOCARBAMOL 500 MG PO TABS
ORAL_TABLET | ORAL | Status: AC
  Filled 2020-09-05: qty 1

## 2020-09-05 MED ORDER — OXYCODONE HCL 5 MG PO TABS
5.0000 mg | ORAL_TABLET | ORAL | Status: DC | PRN
Start: 2020-09-05 — End: 2020-09-06

## 2020-09-05 MED ORDER — EPINEPHRINE PF 1 MG/ML IJ SOLN
INTRAMUSCULAR | Status: AC
  Filled 2020-09-05: qty 1

## 2020-09-05 MED ORDER — HYDROMORPHONE HCL 1 MG/ML IJ SOLN
INTRAMUSCULAR | Status: AC
  Filled 2020-09-05: qty 1

## 2020-09-05 MED ORDER — ONDANSETRON HCL 4 MG PO TABS: 4.0000 mg | ORAL_TABLET | Freq: Four times a day (QID) | ORAL | Status: DC | PRN

## 2020-09-05 MED ORDER — BUPIVACAINE HCL (PF) 0.25 % IJ SOLN
INTRAMUSCULAR | Status: DC | PRN
  Administered 2020-09-05: 10 mL

## 2020-09-05 MED ORDER — LIDOCAINE 2% (20 MG/ML) 5 ML SYRINGE
INTRAMUSCULAR | Status: AC
  Filled 2020-09-05: qty 5

## 2020-09-05 MED ORDER — DEXTROSE 5 % IV SOLN
INTRAVENOUS | Status: DC | PRN
  Administered 2020-09-05 (×2): 3 g via INTRAVENOUS

## 2020-09-05 MED ORDER — HYDROMORPHONE HCL 1 MG/ML IJ SOLN
1.0000 mg | INTRAMUSCULAR | Status: DC | PRN
Start: 2020-09-05 — End: 2020-09-06

## 2020-09-05 MED ORDER — CEFAZOLIN SODIUM-DEXTROSE 1-4 GM/50ML-% IV SOLN
1.0000 g | Freq: Three times a day (TID) | INTRAVENOUS | Status: AC
  Administered 2020-09-05 – 2020-09-06 (×2): 1 g via INTRAVENOUS
  Filled 2020-09-05 (×2): qty 50

## 2020-09-05 MED ORDER — PROPOFOL 1000 MG/100ML IV EMUL
INTRAVENOUS | Status: AC
  Filled 2020-09-05: qty 400

## 2020-09-05 MED ORDER — INSULIN ASPART 100 UNIT/ML ~~LOC~~ SOLN: 0.0000 [IU] | Freq: Every day | SUBCUTANEOUS | Status: DC

## 2020-09-05 MED ORDER — CEFAZOLIN SODIUM 1 G IJ SOLR
INTRAMUSCULAR | Status: AC
  Filled 2020-09-05: qty 10

## 2020-09-05 MED ORDER — SUCCINYLCHOLINE CHLORIDE 200 MG/10ML IV SOSY
PREFILLED_SYRINGE | INTRAVENOUS | Status: AC
  Filled 2020-09-05: qty 10

## 2020-09-05 MED ORDER — OXYCODONE HCL 5 MG PO TABS: 5.0000 mg | ORAL_TABLET | Freq: Once | ORAL | Status: DC | PRN

## 2020-09-05 MED ORDER — OXYCODONE HCL 5 MG PO TABS
10.0000 mg | ORAL_TABLET | ORAL | Status: DC | PRN
  Administered 2020-09-05 – 2020-09-06 (×3): 10 mg via ORAL
  Filled 2020-09-05 (×3): qty 2

## 2020-09-05 MED ORDER — PROPOFOL 500 MG/50ML IV EMUL
INTRAVENOUS | Status: DC | PRN
  Administered 2020-09-05: 150 ug/kg/min via INTRAVENOUS

## 2020-09-05 MED ORDER — METHOCARBAMOL 500 MG PO TABS
500.0000 mg | ORAL_TABLET | Freq: Four times a day (QID) | ORAL | Status: DC | PRN
  Administered 2020-09-05 – 2020-09-06 (×3): 500 mg via ORAL
  Filled 2020-09-05 (×2): qty 1

## 2020-09-05 SURGICAL SUPPLY — 76 items
BAND RUBBER #18 3X1/16 STRL (MISCELLANEOUS) IMPLANT
BLADE CLIPPER SURG (BLADE) IMPLANT
BUR EGG ELITE 4.0 (BURR) IMPLANT
BUR MATCHSTICK NEURO 3.0 LAGG (BURR) IMPLANT
CABLE BIPOLOR RESECTION CORD (MISCELLANEOUS) ×2 IMPLANT
CANISTER SUCT 3000ML PPV (MISCELLANEOUS) ×2 IMPLANT
CLSR STERI-STRIP ANTIMIC 1/2X4 (GAUZE/BANDAGES/DRESSINGS) ×2 IMPLANT
COLLAR CERV LO CONTOUR FIRM DE (SOFTGOODS) IMPLANT
COVER MAYO STAND STRL (DRAPES) ×6 IMPLANT
COVER PLATE (Plate) ×4 IMPLANT
COVER SURGICAL LIGHT HANDLE (MISCELLANEOUS) ×4 IMPLANT
COVER WAND RF STERILE (DRAPES) ×2 IMPLANT
DERMABOND ADVANCED (GAUZE/BANDAGES/DRESSINGS)
DERMABOND ADVANCED .7 DNX12 (GAUZE/BANDAGES/DRESSINGS) IMPLANT
DEVICE FUSION NANLCK MED 5 6D (Cage) ×3 IMPLANT
DEVICE FUSION NANLCK MED 6 6D (Cage) ×1 IMPLANT
DRAPE C-ARM 42X72 X-RAY (DRAPES) ×2 IMPLANT
DRAPE MICROSCOPE LEICA 46X105 (MISCELLANEOUS) IMPLANT
DRAPE POUCH INSTRU U-SHP 10X18 (DRAPES) ×2 IMPLANT
DRAPE SURG 17X23 STRL (DRAPES) ×2 IMPLANT
DRAPE U-SHAPE 47X51 STRL (DRAPES) ×2 IMPLANT
DRSG OPSITE POSTOP 4X6 (GAUZE/BANDAGES/DRESSINGS) ×2 IMPLANT
DRSG TEGADERM 4X4.75 (GAUZE/BANDAGES/DRESSINGS) ×2 IMPLANT
DURAPREP 26ML APPLICATOR (WOUND CARE) ×2 IMPLANT
ELECT COATED BLADE 2.86 ST (ELECTRODE) ×2 IMPLANT
ELECT PENCIL ROCKER SW 15FT (MISCELLANEOUS) ×2 IMPLANT
ELECT REM PT RETURN 9FT ADLT (ELECTROSURGICAL) ×2
ELECTRODE REM PT RTRN 9FT ADLT (ELECTROSURGICAL) ×1 IMPLANT
FUSION NANOLOCK MED 5 6D (Cage) ×6 IMPLANT
FUSION NANOLOCK MED 6 6D (Cage) ×2 IMPLANT
GAUZE SPONGE 4X4 12PLY STRL (GAUZE/BANDAGES/DRESSINGS) ×2 IMPLANT
GLOVE BIO SURGEON STRL SZ 6.5 (GLOVE) ×2 IMPLANT
GLOVE BIOGEL PI IND STRL 8.5 (GLOVE) ×1 IMPLANT
GLOVE BIOGEL PI INDICATOR 8.5 (GLOVE) ×1
GLOVE SS BIOGEL STRL SZ 8.5 (GLOVE) IMPLANT
GLOVE SUPERSENSE BIOGEL SZ 8.5 (GLOVE)
GLOVE SURG UNDER POLY LF SZ6.5 (GLOVE) ×2 IMPLANT
GOWN STRL REUS W/ TWL LRG LVL3 (GOWN DISPOSABLE) ×2 IMPLANT
GOWN STRL REUS W/TWL 2XL LVL3 (GOWN DISPOSABLE) ×4 IMPLANT
GOWN STRL REUS W/TWL LRG LVL3 (GOWN DISPOSABLE) ×4
KIT BASIN OR (CUSTOM PROCEDURE TRAY) ×2 IMPLANT
KIT TURNOVER KIT B (KITS) ×2 IMPLANT
MODULE EMG NEEDLE SSEP NVM5 (NEEDLE) ×2 IMPLANT
NEEDLE HYPO 22GX1.5 SAFETY (NEEDLE) ×2 IMPLANT
NEEDLE MODULE NVMS MEP/EMG (NEEDLE) ×2 IMPLANT
NEEDLE SPNL 18GX3.5 QUINCKE PK (NEEDLE) ×2 IMPLANT
NS IRRIG 1000ML POUR BTL (IV SOLUTION) ×2 IMPLANT
PACK ORTHO CERVICAL (CUSTOM PROCEDURE TRAY) ×2 IMPLANT
PACK UNIVERSAL I (CUSTOM PROCEDURE TRAY) ×2 IMPLANT
PAD ARMBOARD 7.5X6 YLW CONV (MISCELLANEOUS) ×4 IMPLANT
PATTIES SURGICAL .25X.25 (GAUZE/BANDAGES/DRESSINGS) ×4 IMPLANT
PATTIES SURGICAL .5 X.5 (GAUZE/BANDAGES/DRESSINGS) ×2 IMPLANT
PLATE LOCK ENDO TCS (Plate) ×4 IMPLANT
POSITIONER HEAD DONUT 9IN (MISCELLANEOUS) ×2 IMPLANT
PUTTY BONE DBX 5CC MIX (Putty) ×2 IMPLANT
SCREW 3.8X16MM (Screw) ×4 IMPLANT
SCREW ENDO BONE 3.8X14MM (Screw) ×12 IMPLANT
SPONGE INTESTINAL PEANUT (DISPOSABLE) ×8 IMPLANT
SPONGE LAP 4X18 RFD (DISPOSABLE) IMPLANT
SPONGE SURGIFOAM ABS GEL 100 (HEMOSTASIS) ×2 IMPLANT
SURGIFLO W/THROMBIN 8M KIT (HEMOSTASIS) ×4 IMPLANT
SUT BONE WAX W31G (SUTURE) ×2 IMPLANT
SUT MNCRL AB 3-0 PS2 27 (SUTURE) ×2 IMPLANT
SUT SILK 2 0 (SUTURE) ×1
SUT SILK 2-0 18XBRD TIE 12 (SUTURE) ×1 IMPLANT
SUT VIC AB 2-0 CT1 18 (SUTURE) ×2 IMPLANT
SUT VIC AB 3-0 54X BRD REEL (SUTURE) IMPLANT
SUT VIC AB 3-0 BRD 54 (SUTURE)
SYR BULB IRRIG 60ML STRL (SYRINGE) ×2 IMPLANT
SYR CONTROL 10ML LL (SYRINGE) ×2 IMPLANT
TAPE CLOTH 4X10 WHT NS (GAUZE/BANDAGES/DRESSINGS) IMPLANT
TAPE UMBILICAL COTTON 1/8X30 (MISCELLANEOUS) ×4 IMPLANT
TOWEL GREEN STERILE (TOWEL DISPOSABLE) ×2 IMPLANT
TOWEL GREEN STERILE FF (TOWEL DISPOSABLE) ×2 IMPLANT
TRAY FOLEY MTR SLVR 16FR STAT (SET/KITS/TRAYS/PACK) IMPLANT
WATER STERILE IRR 1000ML POUR (IV SOLUTION) IMPLANT

## 2020-09-05 NOTE — Discharge Instructions (Signed)

## 2020-09-05 NOTE — Transfer of Care (Signed)
Immediate Anesthesia Transfer of Care Note  Patient: Jonathon Doyle  Procedure(s) Performed: ANTERIOR CERVICAL DECOMPRESSION/DISCECTOMY FUSION FOUR LEVELS CERVICAL THREE THROUGH SEVEN (N/A Neck)  Patient Location: PACU  Anesthesia Type:General  Level of Consciousness: drowsy and patient cooperative  Airway & Oxygen Therapy: Patient Spontanous Breathing and Patient connected to nasal cannula oxygen  Post-op Assessment: Report given to RN, Post -op Vital signs reviewed and stable and Patient moving all extremities  Post vital signs: Reviewed and stable  Last Vitals:  Vitals Value Taken Time  BP 137/69 09/05/20 1705  Temp    Pulse 89 09/05/20 1708  Resp 21 09/05/20 1708  SpO2 94 % 09/05/20 1708  Vitals shown include unvalidated device data.  Last Pain:  Vitals:   09/05/20 0945  TempSrc: Oral  PainSc:       Patients Stated Pain Goal: 4 (09/05/20 0940)  Complications: No complications documented.

## 2020-09-05 NOTE — H&P (Signed)
Addendum H&P: Patient continues to have significant neck and radicular arm pain and clinical signs of myelopathy.  I have gone over the surgical procedure with him in great detail as well as explained the risks, benefits, and alternatives to surgery.  All of his questions were encouraged and addressed.  There is been no change in his clinical exam since his last office visit of 08/27/2020.  Plan on moving forward with C3-7 anterior cervical discectomy and fusion versus for cervical spondylitic myeloradiculopathy.

## 2020-09-05 NOTE — Anesthesia Procedure Notes (Signed)
Procedure Name: Intubation Date/Time: 09/05/2020 10:43 AM Performed by: Lytle Michaels, CRNA Pre-anesthesia Checklist: Patient identified, Emergency Drugs available, Suction available and Patient being monitored Patient Re-evaluated:Patient Re-evaluated prior to induction Oxygen Delivery Method: Circle system utilized Preoxygenation: Pre-oxygenation with 100% oxygen Induction Type: IV induction Ventilation: Mask ventilation without difficulty Laryngoscope Size: Glidescope and 4 Grade View: Grade I Tube type: Oral Tube size: 7.5 mm Number of attempts: 1 Airway Equipment and Method: Stylet and Oral airway Placement Confirmation: ETT inserted through vocal cords under direct vision,  positive ETCO2 and breath sounds checked- equal and bilateral Secured at: 24 cm Tube secured with: Tape Dental Injury: Teeth and Oropharynx as per pre-operative assessment

## 2020-09-05 NOTE — Anesthesia Preprocedure Evaluation (Signed)
Anesthesia Evaluation  Patient identified by MRN, date of birth, ID band Patient awake    Reviewed: Allergy & Precautions, H&P , NPO status , Patient's Chart, lab work & pertinent test results  Airway Mallampati: II   Neck ROM: full    Dental   Pulmonary asthma ,    breath sounds clear to auscultation       Cardiovascular hypertension,  Rhythm:regular Rate:Normal     Neuro/Psych    GI/Hepatic GERD  ,  Endo/Other  diabetes, Type obesity  Renal/GU      Musculoskeletal  (+) Arthritis ,   Abdominal   Peds  Hematology   Anesthesia Other Findings   Reproductive/Obstetrics                             Anesthesia Physical Anesthesia Plan  ASA: II  Anesthesia Plan: General   Post-op Pain Management:    Induction: Intravenous  PONV Risk Score and Plan: 2 and Ondansetron, Dexamethasone, Midazolam and Treatment may vary due to age or medical condition  Airway Management Planned: Oral ETT  Additional Equipment:   Intra-op Plan:   Post-operative Plan: Extubation in OR  Informed Consent: I have reviewed the patients History and Physical, chart, labs and discussed the procedure including the risks, benefits and alternatives for the proposed anesthesia with the patient or authorized representative who has indicated his/her understanding and acceptance.     Dental advisory given  Plan Discussed with: CRNA, Anesthesiologist and Surgeon  Anesthesia Plan Comments:         Anesthesia Quick Evaluation

## 2020-09-05 NOTE — Brief Op Note (Signed)
09/05/2020  4:58 PM  PATIENT:  Jonathon Doyle  60 y.o. male  PRE-OPERATIVE DIAGNOSIS:  Cervical Myelopathy  POST-OPERATIVE DIAGNOSIS:  Cervical Myelopathy  PROCEDURE:  Procedure(s) with comments: ANTERIOR CERVICAL DECOMPRESSION/DISCECTOMY FUSION FOUR LEVELS CERVICAL THREE THROUGH SEVEN (N/A) - 5 hrs  SURGEON:  Surgeon(s) and Role:    Venita Lick, MD - Primary  PHYSICIAN ASSISTANT:  Glynis Smiles, PA  ASSISTANTS: Marchelle Folks Ward, PA   ANESTHESIA:   general  EBL:  200 mL   BLOOD ADMINISTERED:none  DRAINS: 1 drain   LOCAL MEDICATIONS USED:  MARCAINE     SPECIMEN:  No Specimen  DISPOSITION OF SPECIMEN:  N/A  COUNTS:  YES  TOURNIQUET:  * No tourniquets in log *  DICTATION: .Dragon Dictation  PLAN OF CARE: Admit for overnight observation  PATIENT DISPOSITION:  PACU - hemodynamically stable.

## 2020-09-05 NOTE — Op Note (Signed)
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  5 mm medium lordotic cage at C3-4, C4-5, and C6-7.  6 mm medium lordotic cage at C5-6.  Appropriate size locking screws used at each level.  As well as the locking plate.  Neuro monitoring: No adverse SSEP or evoked motor potential/EMG activity throughout the case.  The conclusion of the case there was improvement in the motor and sensory evoked potentials.  BRIEF HISTORY: Jonathon Doyle is a 60 y.o. male who presented to my office with significant neck pain, radicular arm pain, weakness and some occasional loss of balance.  Clinical exam as well as imaging studies confirmed cervical spondylitic myelopathy.  There was positive cord signal changes and significant foraminal stenosis.  In addition there was multi level degenerative disc disease C3-7.  After discussing treatment options the patient elected to address all of the pathology of the cervical spine.  As result we decided to move forward with an ACDF C3-7.  All appropriate risks, benefits, alternatives to surgery were discussed with the patient and consent was obtained.  PROCEDURE DETAILS: Patient was brought into the operating room. After successful induction of general anesthesia and endotracheal intubation a Time Out was done. This confirmed all pertinent important data.  The anterior cervical spine was prepped and draped in a standard fashion.  Using fluoroscopy I identified the C3-4 and C6-7 levels and marked out my longitudinal  incision.  The incision site was infiltrated with quarter percent Marcaine with epinephrine.  A left-sided incision was made on the medial border of the sternocleidomastoid sharp dissection was carried out down to and through the platysma.  I continued to sharply dissect into the deep cervical fascia until I could identify and isolate the omohyoid muscle.  The omohyoid muscle was then transected for improved visualization.  I continued dissecting through the deep cervical and prevertebral fascia until I was able to manually mobilize the esophagus to the right side.  I then placed a hand-held retractor and identified the carotid sheath on the left side.  I then used Kitner dissectors to continue dissecting to expose the C3-4 disc space down to the C6-7 disc space.  At this point time a needle was placed into the C3-4 disc space and lateral fluoroscopy was taken to confirm that we were at the appropriate level.  Once this was confirmed I used my Bovie to marked the disc space.  I then mobilized the longus coli muscle from the superior aspect of C3 to the inferior aspect of C7 using bipolar electrocautery.  I then placed my Caspar retracting blades underneath the longus coli muscle and deflated the endotracheal cuff and expanded the retractor to the appropriate width.  At this point I had excellent visualization of the C3-4 level.  An annulotomy was then performed with a 15 blade scalpel and I remove the bulk of the disc material with a pituitary rondure.  The overhanging osteophyte was removed with a 2 mm Kerrison rongeur.  Distraction pins were then placed into the body of C3 and 4.  I then used a  lamina spreader to distract the intervertebral space and maintained his distraction with the distraction pin set.  Using my curettes I continued removing the disc material as well as the cartilaginous endplate.  I then continued working until I was at the posterior margin of the vertebral body.  Using my 1 mm Kerrison  rongeur I resected the posterior osteophyte from the C4 and C3 vertebral bodies.  I then used my nerve hook to gently dissect through the posterior annulus and posterior longitudinal ligament.  I was able to create a space between the thecal sac and the PLL.  I then used my 1 mm Kerrison rongeur to resect the PLL.  This allowed me to undercut the uncovertebral joint bilaterally.  At this point I could freely pass my nerve hook underneath the vertebral body of C3 and C4 and the uncovertebral joints.  I confirmed this with fluoroscopy.  At this point I was pleased with the discectomy/decompression.  I then trialed the intervertebral space and elected to use the size 5 mm medium intervertebral space cage.  The cage was packed with the allograft and malleted to the appropriate depth.  A 16 mm locking screw was placed through the cage and into the C3 vertebral body.  A 14 mm screw was placed into the C4 vertebral body.  The wound was copiously irrigated at this point and the locking plate was applied and secured into position.  At this point the retractors were repositioned to expose the C4-5 disc space level.  Using the same exact technique I used at C3-4 I performed a discectomy at this level.  I again made sure to remove the overhanging osteophyte, and remove the cartilaginous endplate to expose the bleeding subchondral bone.  I used my 1 mm Kerrison rongeur and curettes to resect the posterior annulus to allow for adequate discectomy/decompression.  I was able to decompress underneath the uncovertebral joints to further decompress the nerve root.  Once I had adequate discectomy I used my rasp trials and elected to use the size 5 mm medium implant.  The implant was packed with the allograft and malleted into position.  I secured it into the C4 and C5 vertebral body with 14 mm length locking screws.  Once the screws were positioned I then placed the locking plate and secured according manufacture standards.  At this  point I repositioned the blades to expose the C6-7 disc space and using the same technique I performed a discectomy at this level.  I again made sure to remove the posterior osteophyte with my 1 mm Kerrison rongeur and undercut the uncovertebral joint to adequately decompress the thecal sac and provide further foraminal decompression indirectly.  I again measured, rasped and placed the 5 mm medium lordotic implant at this level.  I secured a 16 mm screw through the cage and into the C7 vertebral body and a 14 mm screw into the body of C6.  The locking cap was applied.  I reposition the retractor as well as the distraction pins to expose the C5-6 disc space.  I again used the same technique and performed a discectomy at this level.  I took down the posterior osteophyte and expose the bleeding subchondral bone and decompress the uncovertebral joint.  I then trialed the intervertebral space and elected use the 6 mm medium implant.  This provided a better overall fit.  The implant was obtained and malleted into position and secured into the vertebral body of C5 and C6 with 14  mm screws.  The locking cap was then applied.  At this point all 4 levels have been completed.  The distraction pins were removed and bone wax was placed in the residual bone holes.  I then irrigated the wound copiously with normal saline and made sure that hemostasis using bipolar electrocautery as well as FloSeal.  Any bleeding bone edges were sealed with bone wax.  Because of the length of surgery and the potential for bleeding I did place a drain.  The drain was taken out of a separate stab incision.  After final irrigation the trach and esophagus were returned to the midline.  The platysma was closed with interrupted 2-0 Vicryl suture.  The skin was closed with 3-0 Monocryl.  The drain was also sutured into prevent inadvertent removal.  Steri-Strips and a dry dressing were applied and the patient was ultimately extubated transfer the PACU  without incident.  The end of the case all needle sponge counts were correct.  There were no adverse intraoperative events.  Venita Lick, MD 09/05/2020 4:34 PM

## 2020-09-06 ENCOUNTER — Encounter (HOSPITAL_COMMUNITY): Payer: Self-pay | Admitting: Orthopedic Surgery

## 2020-09-06 DIAGNOSIS — M4712 Other spondylosis with myelopathy, cervical region: Secondary | ICD-10-CM | POA: Diagnosis not present

## 2020-09-06 LAB — GLUCOSE, CAPILLARY
Glucose-Capillary: 133 mg/dL — ABNORMAL HIGH (ref 70–99)
Glucose-Capillary: 165 mg/dL — ABNORMAL HIGH (ref 70–99)

## 2020-09-06 MED FILL — Thrombin (Recombinant) For Soln 20000 Unit: CUTANEOUS | Qty: 1 | Status: AC

## 2020-09-06 NOTE — Evaluation (Signed)
Occupational Therapy Evaluation Patient Details Name: Jonathon Doyle MRN: [id number] DOB: 1961/02/24 Today's Date: 09/06/2020    History of Present Illness 60 y.o. male with diagnosis of Cervical Myelopathy, underwent ANTERIOR CERVICAL DECOMPRESSION/DISCECTOMY FUSION FOUR LEVELS CERVICAL THREE THROUGH SEVEN on 3/23.   Clinical Impression   Patient admitted for the diagnosis and procedure above.  He is complaining of minimal discomfort at the incision site, but is pleased with the outcome of his surgery.  Patient states he fine motor coordination is drastically;ly improved.  Overall, he is Mod I with all ADL from a sit/stand level, and needed no assist with in room mobility/toileting.  No further OT needs in the acute setting.      Follow Up Recommendations  No OT follow up    Equipment Recommendations  None recommended by OT    Recommendations for Other Services       Precautions / Restrictions Precautions Precautions: Cervical Precaution Booklet Issued: Yes (comment) Required Braces or Orthoses: Cervical Brace Cervical Brace: For comfort Restrictions Weight Bearing Restrictions: No Other Position/Activity Restrictions: drain in place      Mobility Bed Mobility Overal bed mobility: Modified Independent             General bed mobility comments: log roll with cueing    Transfers Overall transfer level: Independent                    Balance Overall balance assessment: No apparent balance deficits (not formally assessed)                                         ADL either performed or assessed with clinical judgement   ADL Overall ADL's : Modified independent                                       General ADL Comments: able to complete ADL from a modified sit/stand level.  No difficulties with in room mobility/toileting.     Vision Patient Visual Report: No change from baseline                  Pertinent  Vitals/Pain Pain Assessment: 0-10 Pain Score: 2  Pain Location: surgical site Pain Descriptors / Indicators: Tender Pain Intervention(s): Monitored during session     Hand Dominance Right   Extremity/Trunk Assessment Upper Extremity Assessment Upper Extremity Assessment: Overall WFL for tasks assessed   Lower Extremity Assessment Lower Extremity Assessment: Defer to PT evaluation   Cervical / Trunk Assessment Cervical / Trunk Assessment: Normal   Communication Communication Communication: No difficulties   Cognition Arousal/Alertness: Awake/alert Behavior During Therapy: WFL for tasks assessed/performed Overall Cognitive Status: Within Functional Limits for tasks assessed                                     General Comments   VSS    Exercises     Shoulder Instructions      Home Living Family/patient expects to be discharged to:: Private residence Living Arrangements: Spouse/significant other Available Help at Discharge: Family;Available 24 hours/day Type of Home: House Home Access: Stairs to enter [employer] of Steps: 2   Home Layout: One level     Bathroom Shower/Tub: Walk-in  shower   Bathroom Toilet: Standard     Home Equipment: Cane - quad;Bedside commode;Walker - 2 wheels          Prior Functioning/Environment Level of Independence: Independent                 OT Problem List: Pain      OT Treatment/Interventions:      OT Goals(Current goals can be found in the care plan section) Acute Rehab OT Goals Patient Stated Goal: Start playing my drums again OT Goal Formulation: With patient Time For Goal Achievement: 09/06/20 Potential to Achieve Goals: Good  OT Frequency:     Barriers to D/C:            Co-evaluation              AM-PAC OT "6 Clicks" Daily Activity     Outcome Measure Help from another person eating meals?: None Help from another person taking care of personal grooming?: None Help  from another person toileting, which includes using toliet, bedpan, or urinal?: None Help from another person bathing (including washing, rinsing, drying)?: None Help from another person to put on and taking off regular upper body clothing?: None Help from another person to put on and taking off regular lower body clothing?: None 6 Click Score: 24   End of Session Nurse Communication: Mobility status  Activity Tolerance: Patient tolerated treatment well Patient left: in chair;with call bell/phone within reach  OT Visit Diagnosis: Muscle weakness (generalized) (M62.81)                Time: 8563-1497 OT Time Calculation (min): 23 min Charges:  OT General Charges $OT Visit: 1 Visit OT Evaluation $OT Eval Moderate Complexity: 1 Mod OT Treatments $Self Care/Home Management : 8-22 mins  09/06/2020  Jonathon Doyle, OTR/L  Acute Rehabilitation Services  Office:  [phone]   Jonathon Doyle 09/06/2020, 9:15 AM

## 2020-09-06 NOTE — Anesthesia Postprocedure Evaluation (Signed)
Anesthesia Post Note  Patient: Jonathon Doyle  Procedure(s) Performed: ANTERIOR CERVICAL DECOMPRESSION/DISCECTOMY FUSION FOUR LEVELS CERVICAL THREE THROUGH SEVEN (N/A Neck)     Patient location during evaluation: PACU Anesthesia Type: General Level of consciousness: awake and alert Pain management: pain level controlled Vital Signs Assessment: post-procedure vital signs reviewed and stable Respiratory status: spontaneous breathing, nonlabored ventilation, respiratory function stable and patient connected to nasal cannula oxygen Cardiovascular status: blood pressure returned to baseline and stable Postop Assessment: no apparent nausea or vomiting Anesthetic complications: no   No complications documented.  Last Vitals:  Vitals:   09/06/20 0413 09/06/20 0743  BP: 123/71 (!) 119/52  Pulse: 62 (!) 55  Resp: 18 18  Temp: 36.9 C 36.6 C  SpO2: 93% 94%    Last Pain:  Vitals:   09/06/20 0815  TempSrc:   PainSc: 3                  Kennieth Rad

## 2020-09-06 NOTE — Plan of Care (Signed)
Pt doing well. Pt and wife given D/C instructions with verbal understanding. Rx's were sent to the pharmacy by MD. Pt's incision is clean and dry with no sign of infection. Pt's IV and Hemovac were removed prior to D/C. Pt D/C'd home via wheelchair per MD order. Pt is stable @ D/C and has no other needs at this time. Rema Fendt, RN

## 2020-09-06 NOTE — Evaluation (Signed)
Physical Therapy Evaluation Patient Details Name: Jonathon Doyle MRN: [id number] DOB: Mar 10, 1961 Today's Date: 09/06/2020   History of Present Illness  Pt is a 60 y/o male with diagnosis of Cervical Myelopathy, underwent C3-C7 ACDF on 09/05/20. PMH signficant for HTN, DM, R THR 2019.  Clinical Impression  Pt admitted with above diagnosis. At the time of PT eval, pt was able to demonstrate transfers and ambulation with gross modified independence to supervision for safety and no AD. Pt completed stair training with min guard assist due to baseline knee deficits (unrelated to spinal surgery). Pt was educated on precautions, brace application/wearing schedule, appropriate activity progression, and car transfer. Pt currently with functional limitations due to the deficits listed below (see PT Problem List). Pt will benefit from skilled PT to increase their independence and safety with mobility to allow discharge to the venue listed below.      Follow Up Recommendations No PT follow up;Supervision for mobility/OOB    Equipment Recommendations  None recommended by PT    Recommendations for Other Services       Precautions / Restrictions Precautions Precautions: Cervical Precaution Booklet Issued: Yes (comment) Required Braces or Orthoses: Cervical Brace Cervical Brace: Hard collar Restrictions Weight Bearing Restrictions: No Other Position/Activity Restrictions: drain in place      Mobility  Bed Mobility Overal bed mobility: Modified Independent             General bed mobility comments: Pt was recevied sitting up EOB.    Transfers Overall transfer level: Modified independent Equipment used: None             General transfer comment: Increased time but no assist required. Pt able to maintain good upright posture with transition to stand.  Ambulation/Gait Ambulation/Gait assistance: Supervision Gait Distance (Feet): 300 Feet Assistive device: None Gait  Pattern/deviations: Step-through pattern;Decreased stride length;Trunk flexed Gait velocity: Decreased Gait velocity interpretation: <1.31 ft/sec, indicative of household ambulator General Gait Details: VC's for improved posture and forward gaze at times. Overall antalgic due to baseline knee deficits, however pt reports he is ambulating better now than prior to surgery.  Stairs Stairs: Yes Stairs assistance: Min guard Stair Management: One rail Right;Step to pattern;Forwards Number of Stairs: 3 General stair comments: VC's for seqeuncing and general safety. Pt with increased effort due to baseline knee deficits.  Wheelchair Mobility    Modified Rankin (Stroke Patients Only)       Balance Overall balance assessment: No apparent balance deficits (not formally assessed)                                           Pertinent Vitals/Pain Pain Assessment: Faces Pain Score: 2  Faces Pain Scale: Hurts little more Pain Location: surgical site Pain Descriptors / Indicators: Operative site guarding;Sore Pain Intervention(s): Limited activity within patient's tolerance;Monitored during session;Repositioned    Home Living Family/patient expects to be discharged to:: Private residence Living Arrangements: Spouse/significant other Available Help at Discharge: Family;Available 24 hours/day Type of Home: House Home Access: Stairs to enter   [employer] of Steps: 2 Home Layout: One level Home Equipment: Cane - quad;Bedside commode;Walker - 2 wheels      Prior Function Level of Independence: Independent         Comments: 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    Cervical / Trunk Assessment Cervical / Trunk Assessment: Normal  Communication   Communication: No difficulties  Cognition Arousal/Alertness: Awake/alert Behavior During Therapy: WFL for tasks assessed/performed Overall Cognitive Status: Within Functional Limits for tasks assessed                                        General Comments      Exercises     Assessment/Plan    PT Assessment Patient needs continued PT services  PT Problem List Decreased strength;Decreased activity tolerance;Decreased balance;Decreased mobility;Decreased knowledge of use of DME;Decreased safety awareness;Decreased knowledge of precautions;Pain       PT Treatment Interventions DME instruction;Gait training;Stair training;Functional mobility training;Therapeutic activities;Therapeutic exercise;Neuromuscular re-education;Patient/family education    PT Goals (Current goals can be found in the Care Plan section)  Acute Rehab PT Goals Patient Stated Goal: Start playing my drums again PT Goal Formulation: With patient Time For Goal Achievement: 09/13/20 Potential to Achieve Goals: Good    Frequency Min 5X/week   Barriers to discharge        Co-evaluation               AM-PAC PT "6 Clicks" Mobility  Outcome Measure Help needed turning from your back to your side while in a flat bed without using bedrails?: None Help needed moving from lying on your back to sitting on the side of a flat bed without using bedrails?: None Help needed moving to and from a bed to a chair (including a wheelchair)?: A Little Help needed standing up from a chair using your arms (e.g., wheelchair or bedside chair)?: A Little Help needed to walk in hospital room?: A Little Help needed climbing 3-5 steps with a railing? : A Little 6 Click Score: 20    End of Session Equipment Utilized During Treatment: Gait belt;Cervical  collar Activity Tolerance: Patient tolerated treatment well Patient left: in bed;with call bell/phone within reach (Sittign EOB) Nurse Communication: Mobility status PT Visit Diagnosis: Unsteadiness on feet (R26.81);Pain Pain - part of body:  (neck incision site)    Time: 7014-1030 PT Time Calculation (min) (ACUTE ONLY): 17 min   Charges:   PT Evaluation $PT Eval Low Complexity: 1 Low          Conni Slipper, PT, DPT Acute Rehabilitation Services Pager: [phone] Office: [phone]   Marylynn Pearson 09/06/2020, 10:44 AM

## 2020-09-06 NOTE — Progress Notes (Signed)
    Subjective: Procedure(s) (LRB): ANTERIOR CERVICAL DECOMPRESSION/DISCECTOMY FUSION FOUR LEVELS CERVICAL THREE THROUGH SEVEN (N/A) 1 Day Post-Op  Patient reports pain as 1 on 0-10 scale.  Reports decreased arm pain denies incisional neck pain   Positive void Negative bowel movement Positive flatus Negative chest pain or shortness of breath  Objective: Vital signs in last 24 hours: Temp:  [97.9 F (36.6 C)-98.8 F (37.1 C)] 97.9 F (36.6 C) (03/24 0743) Pulse Rate:  [55-89] 55 (03/24 0743) Resp:  [10-20] 18 (03/24 0743) BP: (119-144)/(52-75) 119/52 (03/24 0743) SpO2:  [91 %-100 %] 94 % (03/24 0743)  Intake/Output from previous day: 03/23 0701 - 03/24 0700 In: 2110 [P.O.:120; I.V.:1660; IV Piggyback:330] Out: 1030 [Urine:800; Drains:30; Blood:200]  Labs: Recent Labs    09/04/20 1451  WBC 6.3  RBC 4.58  HCT 41.0  PLT 210   Recent Labs    09/04/20 1451  NA 138  K 3.8  CL 102  CO2 30  BUN 10  CREATININE 0.91  GLUCOSE 124*  CALCIUM 9.4   Recent Labs    09/04/20 1451  INR 1.0    Physical Exam: Neurologically intact ABD soft Intact pulses distally Incision: dressing C/D/I and no drainage Compartment soft There is no height or weight on file to calculate BMI.  Assessment/Plan: Patient stable  xrays n/a Mobilization with physical therapy Encourage incentive spirometry Continue care  Advance diet Up with therapy  Doing well Improvement in motor strength noted Plan on d/c to home later today.  Will remove drain as output decreased and there is no swelling or dysphagia.   F/u in 2 weeks  Venita Lick, MD [facility] [phone]

## 2020-09-07 NOTE — Discharge Summary (Signed)
Patient ID: Jonathon Doyle MRN: [id number] DOB/AGE: 1961/02/20 60 y.o.  Admit date: 09/05/2020 Discharge date: 09/07/2020  Admission Diagnoses:  Active Problems:   Cervical myelopathy [facility])   Discharge Diagnoses:  Active Problems:   Cervical myelopathy (HCC)  status post Procedure(s): ANTERIOR CERVICAL DECOMPRESSION/DISCECTOMY FUSION FOUR LEVELS CERVICAL THREE THROUGH SEVEN  Past Medical History:  Diagnosis Date  . Acid reflux   . Arthritis    per patient "all over, neck, back, hands, knees"  . Asthma    per patient as a kid and hasn't had any problems since then  . Diabetes mellitus without complication (HCC)   . Hypertension   . Obesity     Surgeries: Procedure(s): ANTERIOR CERVICAL DECOMPRESSION/DISCECTOMY FUSION FOUR LEVELS CERVICAL THREE THROUGH SEVEN on 09/05/2020   Consultants:   Discharged Condition: Improved  Hospital Course: Jonathon Doyle is an 60 y.o. male who was admitted 09/05/2020 for operative treatment of cervical myelopathy. Patient failed conservative treatments (please see the history and physical for the specifics) and had severe unremitting pain that affects sleep, daily activities and work/hobbies. After pre-op clearance, the patient was taken to the operating room on 09/05/2020 and underwent  Procedure(s): ANTERIOR CERVICAL DECOMPRESSION/DISCECTOMY FUSION FOUR LEVELS CERVICAL THREE THROUGH SEVEN.    Patient was given perioperative antibiotics:  Anti-infectives (From admission, onward)   Start     Dose/Rate Route Frequency Ordered Stop   09/05/20 1900  ceFAZolin (ANCEF) IVPB 1 g/50 mL premix        1 g 100 mL/hr over 30 Minutes Intravenous Every 8 hours 09/05/20 1810 09/06/20 0445   09/05/20 0917  ceFAZolin (ANCEF) IVPB 2g/100 mL premix  Status:  Discontinued        2 g 200 mL/hr over 30 Minutes Intravenous 30 min pre-op 09/05/20 0917 09/05/20 1808   09/05/20 0904  ceFAZolin (ANCEF) 2-4 GM/100ML-% IVPB       Note to Pharmacy: Lorenda Ishihara    : cabinet override      09/05/20 4580 09/05/20 2114       Patient was given sequential compression devices and early ambulation to prevent DVT.   Patient benefited maximally from hospital stay and there were no complications. At the time of discharge, the patient was urinating/moving their bowels without difficulty, tolerating a regular diet, pain is controlled with oral pain medications and they have been cleared by PT/OT.   Recent vital signs: No data found.   Recent laboratory studies:  Recent Labs    09/04/20 1451  WBC 6.3  HGB 13.6  HCT 41.0  PLT 210  NA 138  K 3.8  CL 102  CO2 30  BUN 10  CREATININE 0.91  GLUCOSE 124*  INR 1.0  CALCIUM 9.4     Discharge Medications:   Allergies as of 09/06/2020      Reactions   Latex Itching, Swelling, Rash   SWELLING REACTION UNSPECIFIED       Medication List    STOP taking these medications   meloxicam 15 MG tablet Commonly known as: MOBIC     TAKE these medications   gabapentin 300 MG capsule Commonly known as: NEURONTIN Take 300 mg by mouth at bedtime.   lisinopril-hydrochlorothiazide 10-12.5 MG tablet Commonly known as: ZESTORETIC Take 1 tablet by mouth in the morning.   loratadine 10 MG tablet Commonly known as: CLARITIN Take 10 mg by mouth daily as needed (ALLERGIES DURING SPRING MONTHS).   metFORMIN 850 MG tablet Commonly known as: GLUCOPHAGE Take 850 mg by  mouth daily with breakfast. with food   methocarbamol 500 MG tablet Commonly known as: Robaxin Take 1 tablet (500 mg total) by mouth every 8 (eight) hours as needed for up to 5 days for muscle spasms.   ondansetron 4 MG tablet Commonly known as: Zofran Take 1 tablet (4 mg total) by mouth every 8 (eight) hours as needed for nausea or vomiting.   oxyCODONE-acetaminophen 10-325 MG tablet Commonly known as: Percocet Take 1 tablet by mouth every 6 (six) hours as needed for up to 5 days for pain.   pioglitazone 15 MG tablet Commonly known as:  ACTOS Take 15 mg by mouth in the morning.   rosuvastatin 10 MG tablet Commonly known as: CRESTOR Take 10 mg by mouth in the morning.   Vitamin B-12 3000 MCG Subl Take 3,000 mcg by mouth daily.       Diagnostic Studies: DG Chest 2 View  Result Date: 09/05/2020 CLINICAL DATA:  Preop for cervical spine surgery. EXAM: CHEST - 2 VIEW COMPARISON:  None. FINDINGS: Cardiac silhouette is normal in size and configuration. Normal mediastinal and hilar contours. Mild linear opacity noted at the left anterior lung base consistent with scarring or atelectasis. Lungs are otherwise clear. No pleural effusion or pneumothorax. Skeletal structures are intact. IMPRESSION: No active cardiopulmonary disease. Electronically Signed   By: Amie Portland M.D.   On: 09/05/2020 17:01   DG Cervical Spine 2-3 Views  Result Date: 09/05/2020 CLINICAL DATA:  Surgery, elective. Additional history provided: Anterior cervical decompression/discectomy fusion 4 levels cervical 3 through 7. Provided fluoroscopy time 2 minutes, 13 seconds (44.17 mGy). EXAM: CERVICAL SPINE - 2-3 VIEW; DG C-ARM 1-60 MIN COMPARISON:  No pertinent prior exams available for comparison. FINDINGS: AP and lateral view intraoperative fluoroscopic images of the cervical spine are submitted, 10 images total. The images demonstrate interval anterior cervical discectomy and fusion at the C3-C4, C4-C5, C5-C6 and C6-C7 levels. No unexpected finding on the most recent provided images. Partially visualized ET tube. IMPRESSION: Ten intraoperative fluoroscopic images of the cervical spine from C3-C7 ACDF. No unexpected finding. Electronically Signed   By: Jackey Loge DO   On: 09/05/2020 16:36   DG C-Arm 1-60 Min  Result Date: 09/05/2020 CLINICAL DATA:  Surgery, elective. Additional history provided: Anterior cervical decompression/discectomy fusion 4 levels cervical 3 through 7. Provided fluoroscopy time 2 minutes, 13 seconds (44.17 mGy). EXAM: CERVICAL SPINE - 2-3  VIEW; DG C-ARM 1-60 MIN COMPARISON:  No pertinent prior exams available for comparison. FINDINGS: AP and lateral view intraoperative fluoroscopic images of the cervical spine are submitted, 10 images total. The images demonstrate interval anterior cervical discectomy and fusion at the C3-C4, C4-C5, C5-C6 and C6-C7 levels. No unexpected finding on the most recent provided images. Partially visualized ET tube. IMPRESSION: Ten intraoperative fluoroscopic images of the cervical spine from C3-C7 ACDF. No unexpected finding. Electronically Signed   By: Jackey Loge DO   On: 09/05/2020 16:36    Discharge Instructions    Incentive spirometry RT   Complete by: As directed        Follow-up Information    Venita Lick, MD. Schedule an appointment as soon as possible for a visit in 2 weeks.   Specialty: Orthopedic Surgery Why: If symptoms worsen, For suture removal, For wound re-check Contact information: [address] [place] [address] [phone]               Discharge Plan:  discharge to home  Disposition: stable  Signed: Leonette Monarch Ward for [facility] PA-C [facility] [phone] 09/07/2020, 10:19 AM

## 2020-10-03 ENCOUNTER — Encounter (HOSPITAL_COMMUNITY): Payer: Self-pay | Admitting: Orthopedic Surgery

## 2022-02-18 IMAGING — RF DG CERVICAL SPINE 2 OR 3 VIEWS
1 series · 10 of 10 positions shown · non-contrast
Comparison: No pertinent prior exams available for comparison.

CLINICAL DATA: Surgery, elective. Additional history provided:
Anterior cervical decompression/discectomy fusion 4 levels cervical
3 through 7. Provided fluoroscopy time 2 minutes, 13 seconds (44.17
mGy).

EXAM:
CERVICAL SPINE - 2-3 VIEW; DG C-ARM 1-60 MIN

[Series 1: run · 10 of 10 slices shown]
[im 1/10]
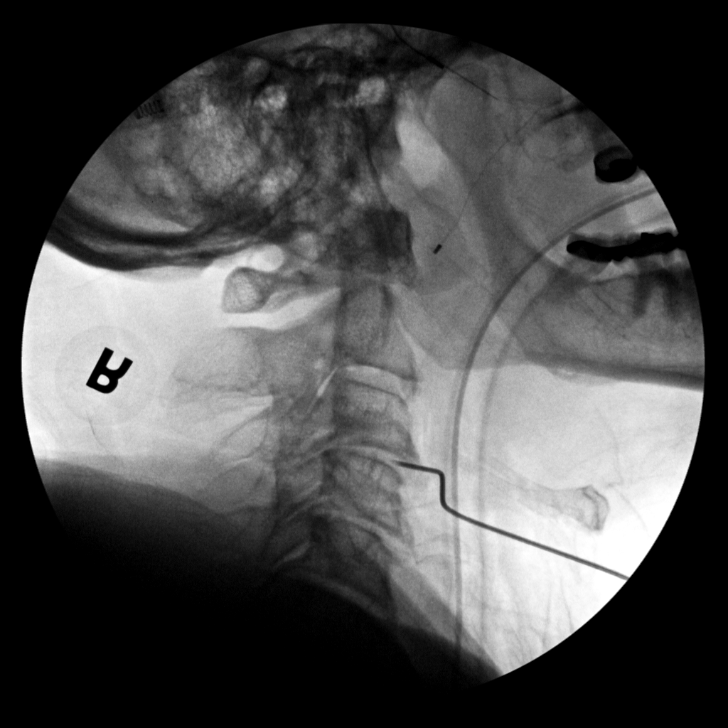
[im 2/10]
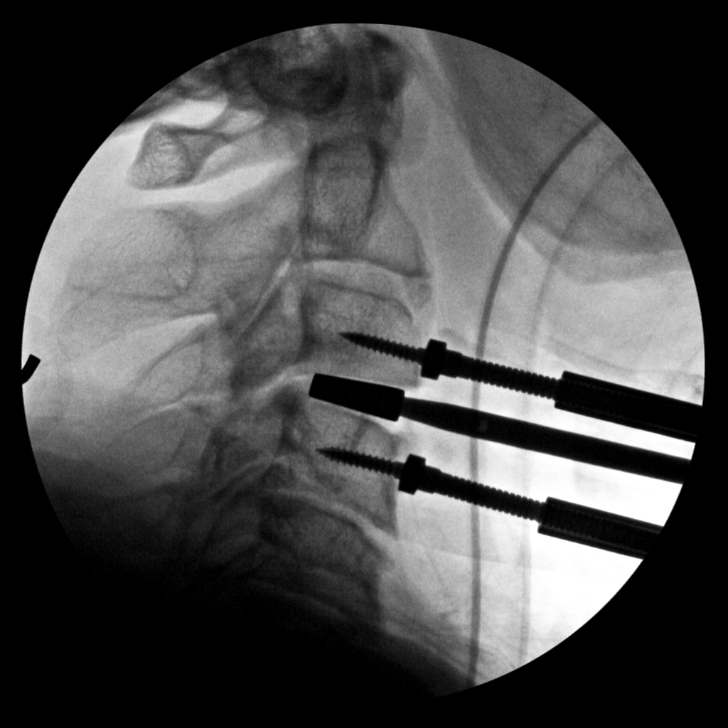
[im 3/10]
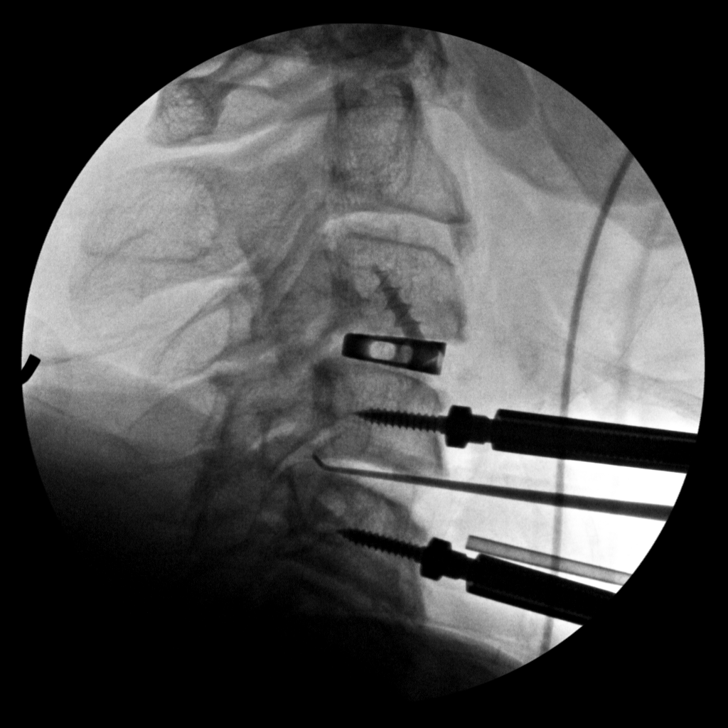
[im 4/10]
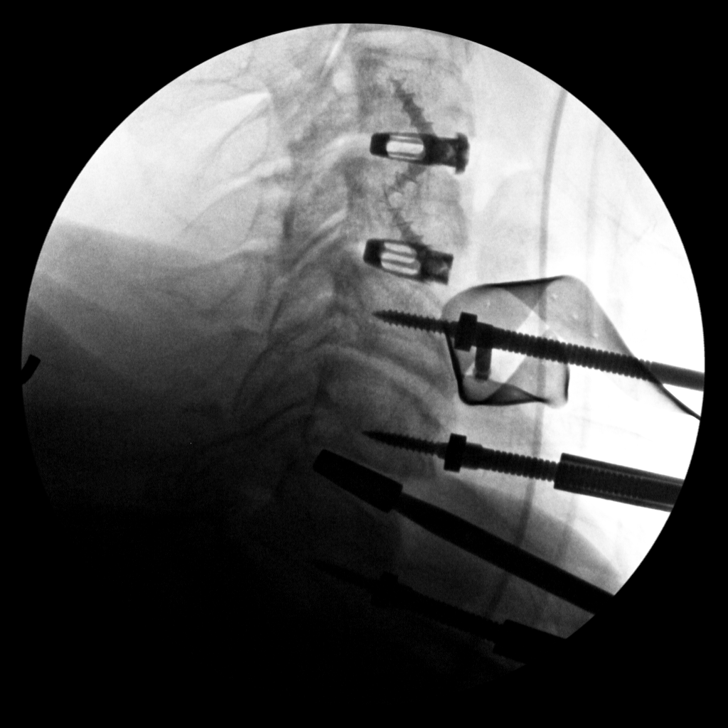
[im 5/10]
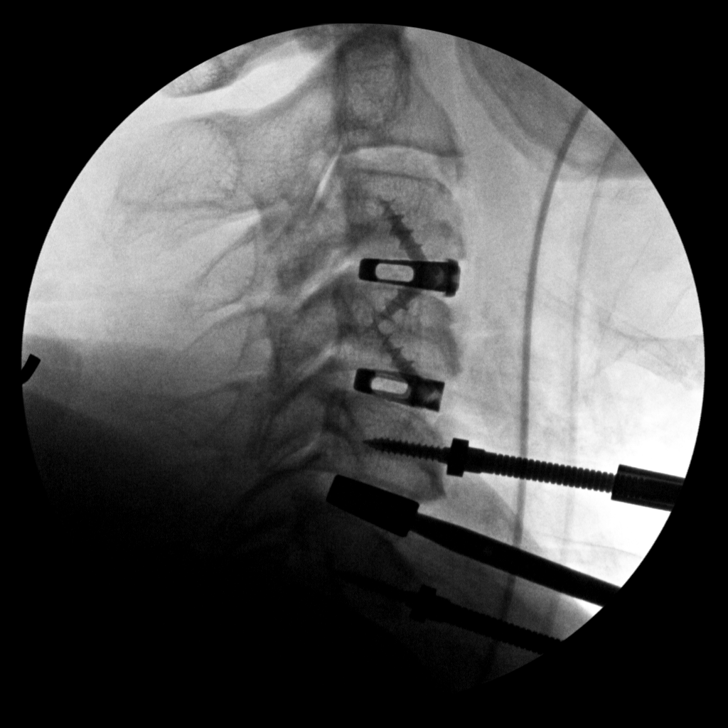
[im 6/10]
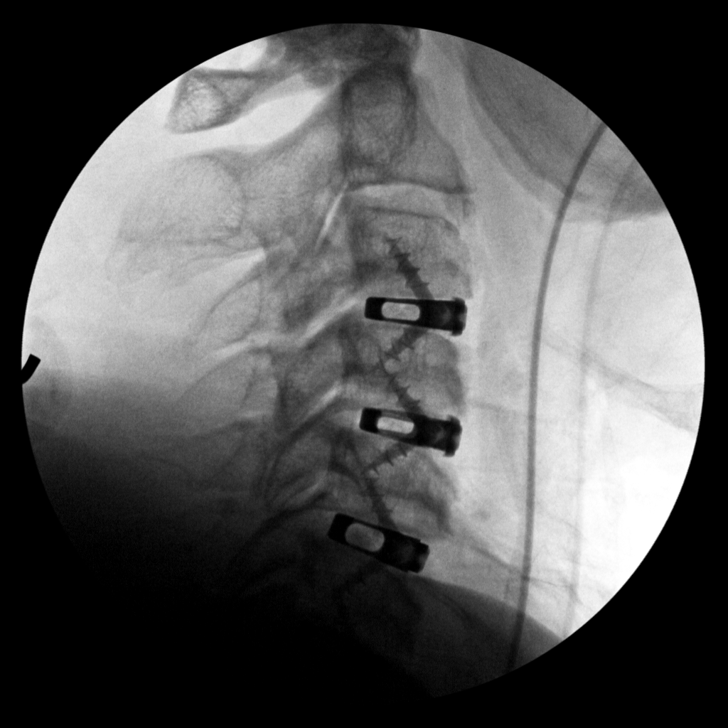
[im 7/10]
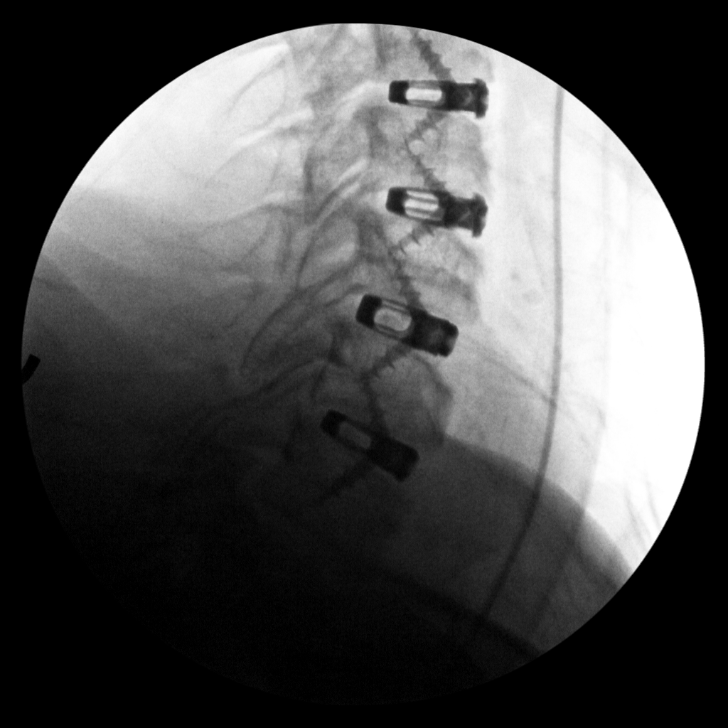
[im 8/10]
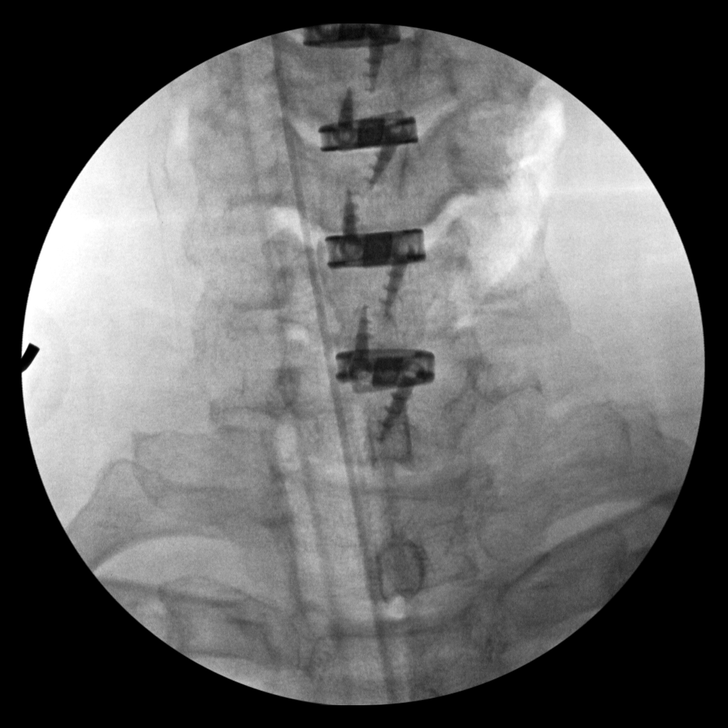
[im 9/10]
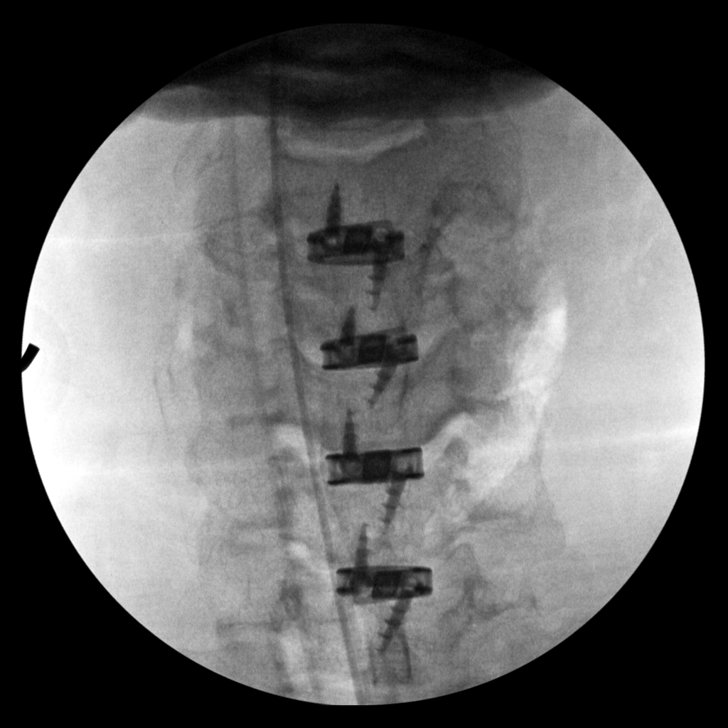
[im 10/10]
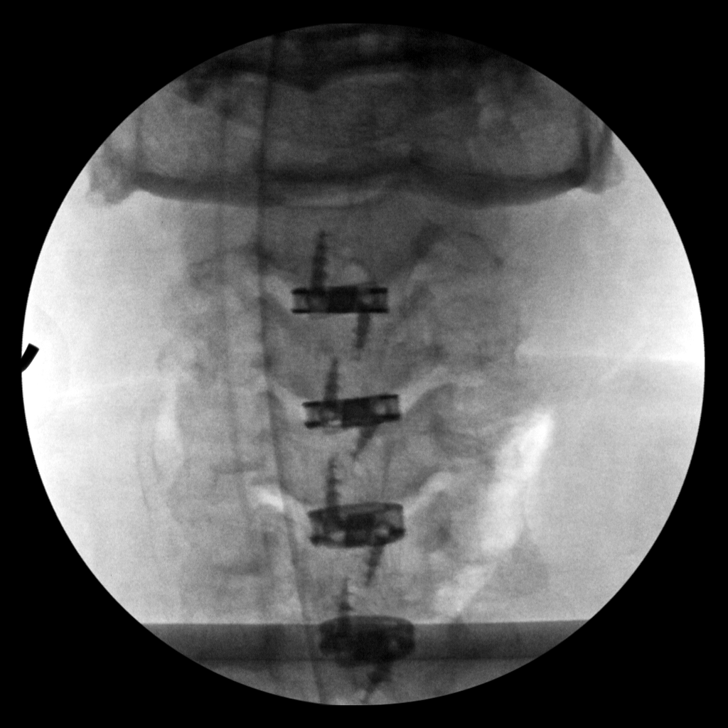

[10 of 10 positions shown; findings below may reference images not displayed]

FINDINGS: AP and lateral view intraoperative fluoroscopic images of the
cervical spine are submitted, 10 images total. The images
demonstrate interval anterior cervical discectomy and fusion at the
C3-C4, C4-C5, C5-C6 and C6-C7 levels. No unexpected finding on the
most recent provided images. Partially visualized ET tube.
IMPRESSION: Ten intraoperative fluoroscopic images of the cervical spine from
C3-C7 ACDF. No unexpected finding.

## 2022-02-18 IMAGING — RF DG C-ARM 1-60 MIN
1 series · 10 of 10 positions shown · non-contrast
Comparison: No pertinent prior exams available for comparison.

CLINICAL DATA: Surgery, elective. Additional history provided:
Anterior cervical decompression/discectomy fusion 4 levels cervical
3 through 7. Provided fluoroscopy time 2 minutes, 13 seconds (44.17
mGy).

EXAM:
CERVICAL SPINE - 2-3 VIEW; DG C-ARM 1-60 MIN

[Series 1: run · 10 of 10 slices shown]
[im 1/10]
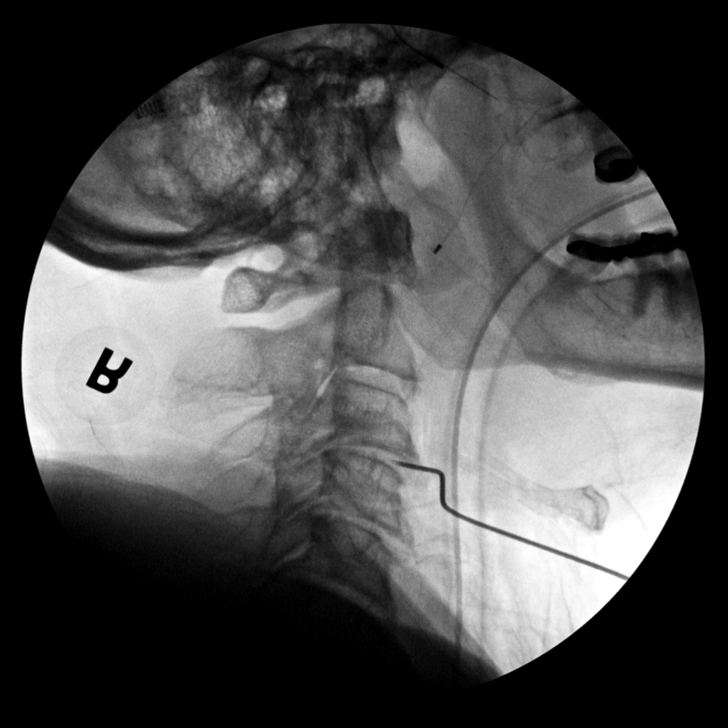
[im 2/10]
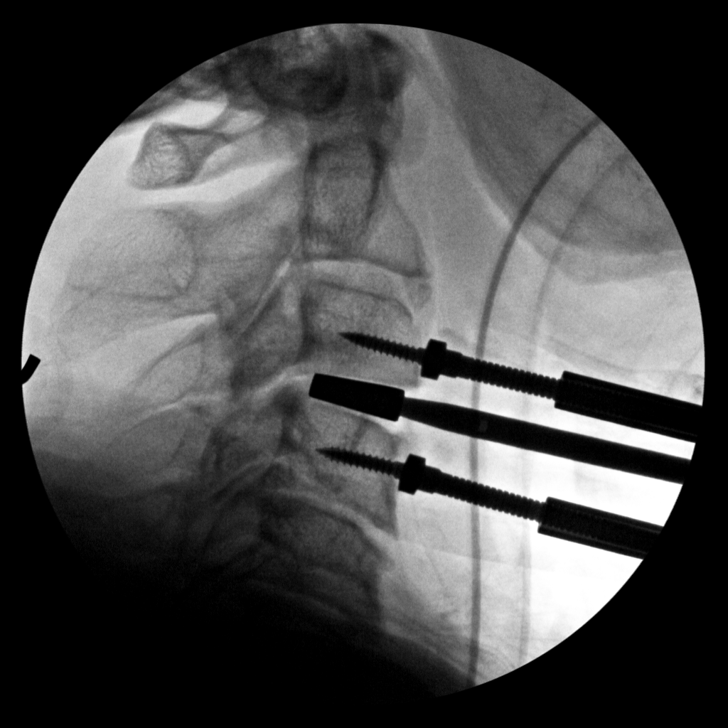
[im 3/10]
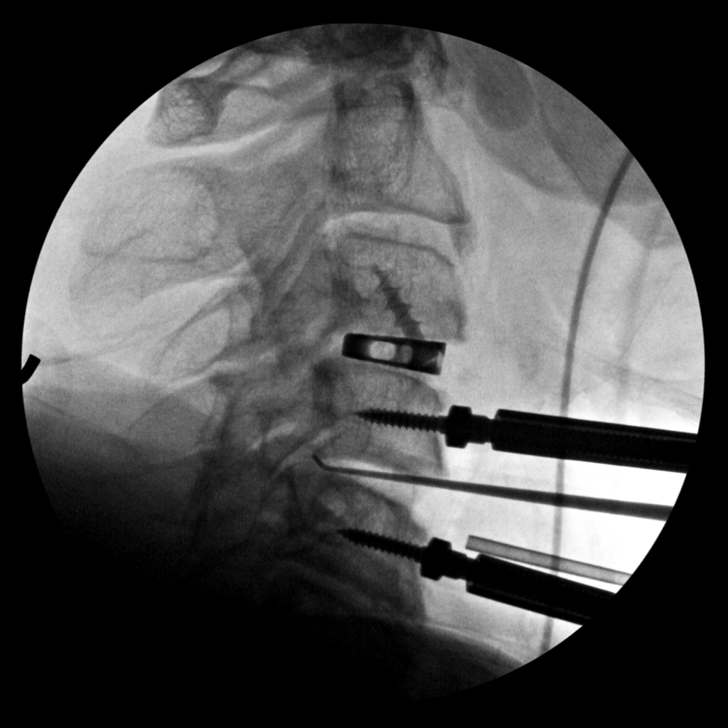
[im 4/10]
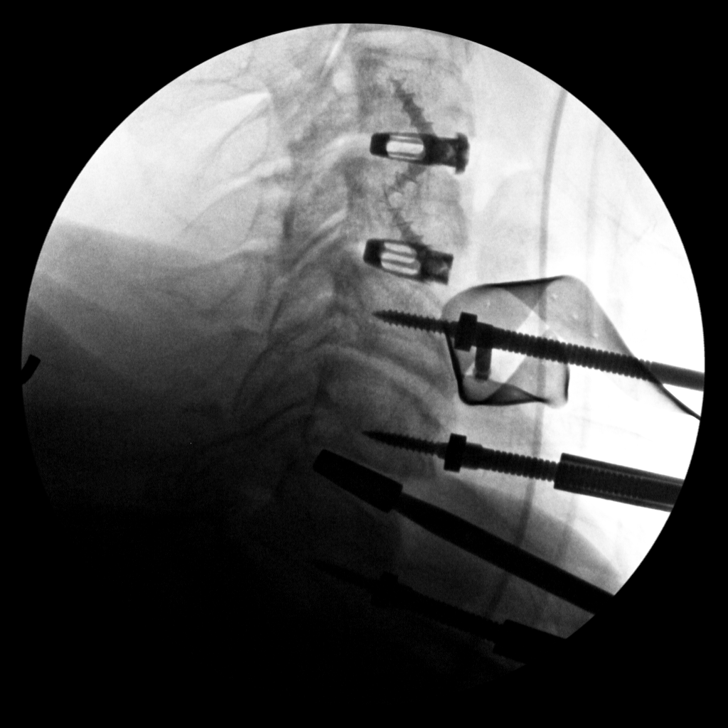
[im 5/10]
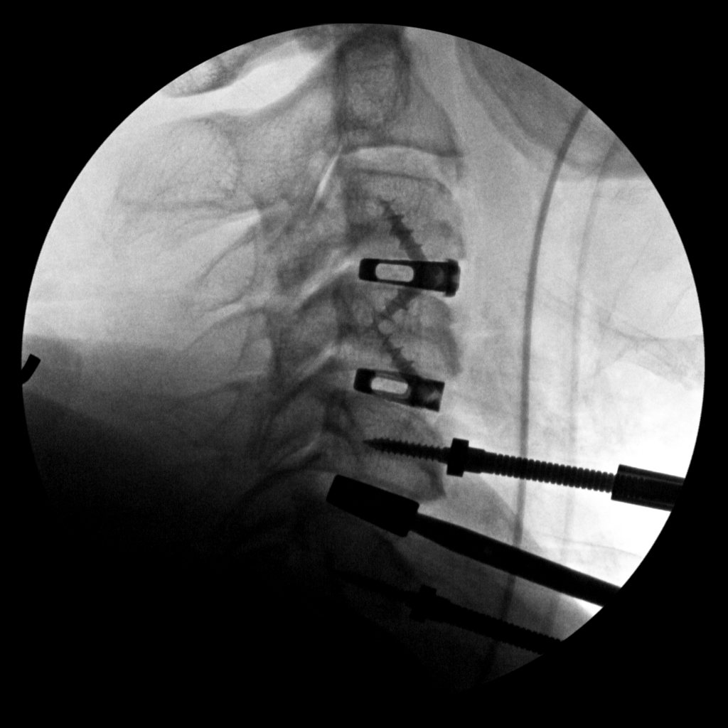
[im 6/10]
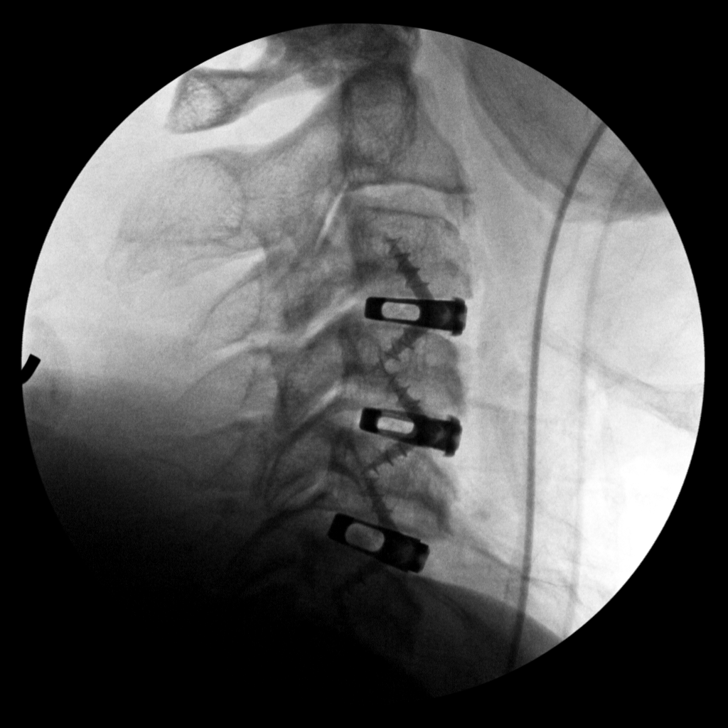
[im 7/10]
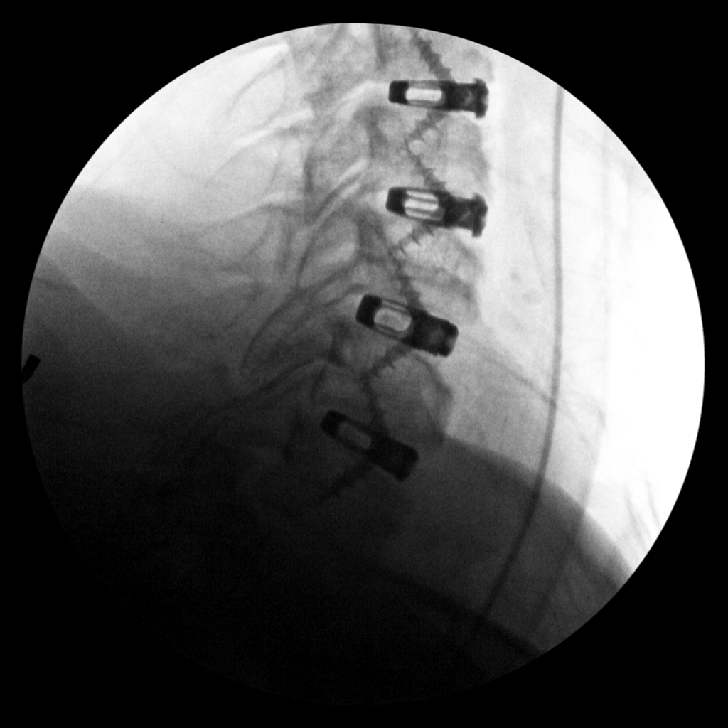
[im 8/10]
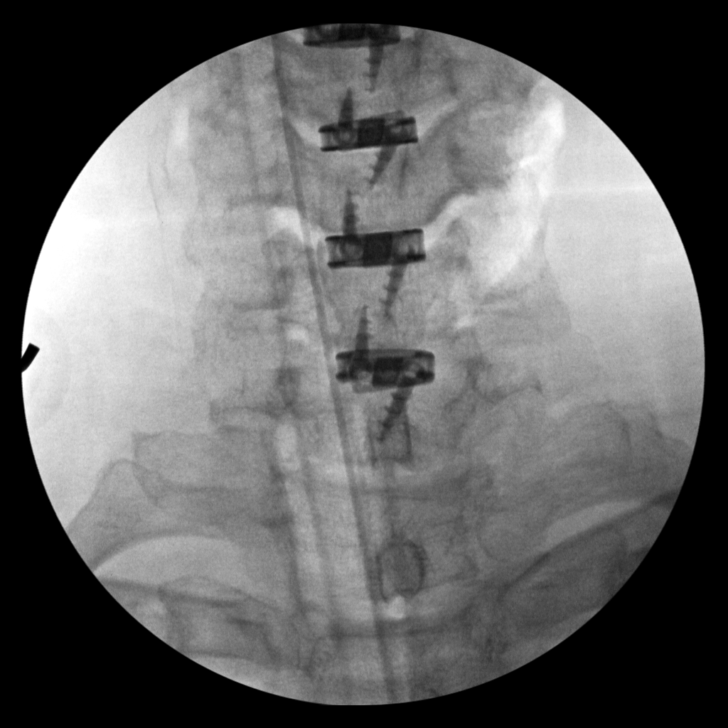
[im 9/10]
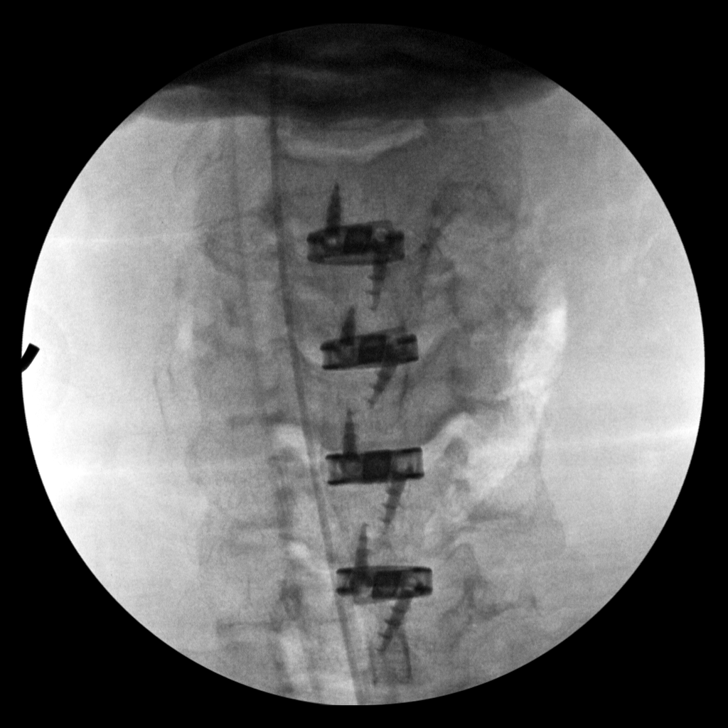
[im 10/10]
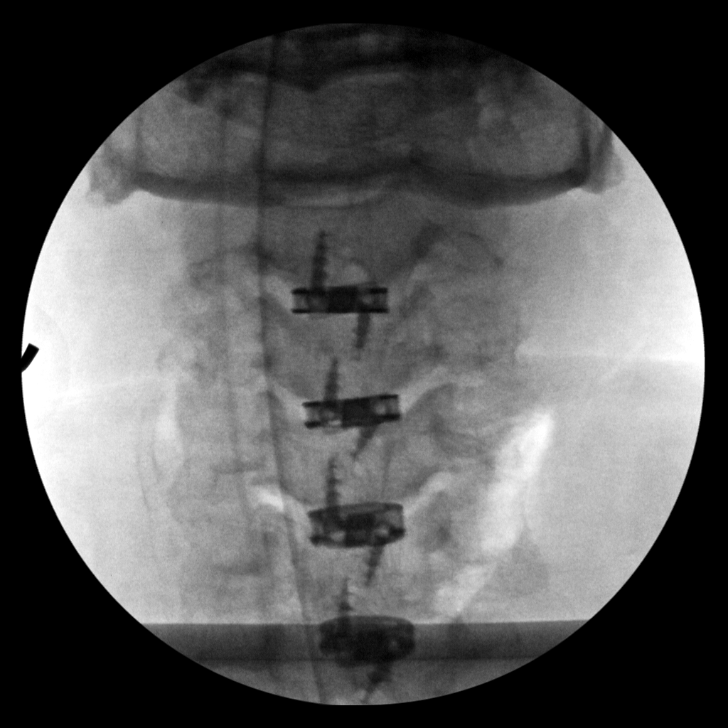

[10 of 10 positions shown; findings below may reference images not displayed]

FINDINGS: AP and lateral view intraoperative fluoroscopic images of the
cervical spine are submitted, 10 images total. The images
demonstrate interval anterior cervical discectomy and fusion at the
C3-C4, C4-C5, C5-C6 and C6-C7 levels. No unexpected finding on the
most recent provided images. Partially visualized ET tube.
IMPRESSION: Ten intraoperative fluoroscopic images of the cervical spine from
C3-C7 ACDF. No unexpected finding.

## 2024-06-22 ENCOUNTER — Ambulatory Visit: Payer: Self-pay | Admitting: Student

## 2024-06-22 DIAGNOSIS — E119 Type 2 diabetes mellitus without complications: Secondary | ICD-10-CM

## 2024-06-22 NOTE — H&P (View-Only) (Signed)
 TOTAL KNEE ADMISSION H&P  Patient is being admitted for right total knee arthroplasty.  Subjective:  Chief Complaint:right knee pain.  HPI: Jonathon Doyle, 64 y.o. male, has a history of pain and functional disability in the right knee due to arthritis and has failed non-surgical conservative treatments for greater than 12 weeks to includeNSAID's and/or analgesics, corticosteriod injections, viscosupplementation injections, flexibility and strengthening excercises, use of assistive devices, and activity modification.  Onset of symptoms was gradual, starting >10 years ago with rapidlly worsening course since that time. The patient noted no past surgery on the right knee(s).  Patient currently rates pain in the right knee(s) at 10 out of 10 with activity. Patient has night pain, worsening of pain with activity and weight bearing, pain that interferes with activities of daily living, pain with passive range of motion, crepitus, and joint swelling.  Patient has evidence of subchondral cysts, subchondral sclerosis, periarticular osteophytes, joint subluxation, joint space narrowing, and bone loss by imaging studies. There is no active infection.  Patient Active Problem List   Diagnosis Date Noted   Cervical myelopathy (HCC) 09/05/2020   Primary osteoarthritis of hip 10/06/2017   Diabetes mellitus without complication (HCC) 09/16/2017   Primary osteoarthritis of right hip 09/16/2017   Past Medical History:  Diagnosis Date   Acid reflux    Arthritis    per patient all over, neck, back, hands, knees   Asthma    per patient as a kid and hasn't had any problems since then   Diabetes mellitus without complication (HCC)    Hypertension    Obesity     Past Surgical History:  Procedure Laterality Date   ANTERIOR CERVICAL DECOMPRESSION/DISCECTOMY FUSION 4 LEVELS N/A 09/05/2020   Procedure: ANTERIOR CERVICAL DECOMPRESSION/DISCECTOMY FUSION FOUR LEVELS CERVICAL THREE THROUGH SEVEN;  Surgeon: Burnetta Aures, MD;  Location: [facility] OR;  Service: Orthopedics;  Laterality: N/A;  5 hrs   COLONOSCOPY     EYE SURGERY Bilateral 2014   cataract surgery   TONSILLECTOMY     TOTAL HIP ARTHROPLASTY Right 10/06/2017   Procedure: TOTAL HIP ARTHROPLASTY ANTERIOR APPROACH;  Surgeon: Beverley Evalene BIRCH, MD;  Location: [facility] OR;  Service: Orthopedics;  Laterality: Right;    Current Outpatient Medications  Medication Sig Dispense Refill Last Dose/Taking   acetaminophen  (TYLENOL ) 500 MG tablet Take 1,000 mg by mouth daily.      Calcium  Carbonate Antacid (TUMS PO) Take 1 tablet by mouth daily as needed (heartburn).      Cyanocobalamin (VITAMIN B-12) 3000 MCG SUBL Take 3,000 mcg by mouth daily. (Patient not taking: Reported on 06/22/2024)      cyclobenzaprine (FLEXERIL) 10 MG tablet Take 10 mg by mouth at bedtime.      gabapentin  (NEURONTIN ) 300 MG capsule Take 300 mg by mouth at bedtime. (Patient not taking: Reported on 06/22/2024)      lisinopril  (ZESTRIL ) 10 MG tablet Take 10 mg by mouth daily.      loratadine (CLARITIN) 10 MG tablet Take 10 mg by mouth daily as needed (ALLERGIES DURING SPRING MONTHS). (Patient not taking: Reported on 06/22/2024)      meloxicam (MOBIC) 15 MG tablet Take 15 mg by mouth daily.      metFORMIN  (GLUCOPHAGE ) 850 MG tablet Take 850 mg by mouth daily with breakfast. with food  0    ondansetron  (ZOFRAN ) 4 MG tablet Take 1 tablet (4 mg total) by mouth every 8 (eight) hours as needed for nausea or vomiting. (Patient not taking: Reported on 06/22/2024) 20  tablet 0    OZEMPIC, 2 MG/DOSE, 8 MG/3ML SOPN Inject 2 mg into the skin once a week.      pioglitazone  (ACTOS ) 15 MG tablet Take 15 mg by mouth in the morning. (Patient not taking: Reported on 06/22/2024)  0    rosuvastatin  (CRESTOR ) 10 MG tablet Take 10 mg by mouth in the morning.  0    No current facility-administered medications for this visit.   Allergies[1]  Social History   Tobacco Use   Smoking status: Never   Smokeless tobacco: Former     Types: Chew  Substance Use Topics   Alcohol use: Yes    Comment: occasionally    No family history on file.   Review of Systems  Musculoskeletal:  Positive for arthralgias, gait problem and joint swelling.  All other systems reviewed and are negative.   Objective:  Physical Exam Constitutional:      Appearance: Normal appearance.  HENT:     Head: Normocephalic and atraumatic.     Nose: Nose normal.     Mouth/Throat:     Mouth: Mucous membranes are moist.     Pharynx: Oropharynx is clear.  Eyes:     Conjunctiva/sclera: Conjunctivae normal.  Cardiovascular:     Rate and Rhythm: Normal rate and regular rhythm.     Pulses: Normal pulses.     Heart sounds: Normal heart sounds.  Pulmonary:     Effort: Pulmonary effort is normal.     Breath sounds: Normal breath sounds.  Abdominal:     General: Abdomen is flat.     Palpations: Abdomen is soft.  Genitourinary:    Comments: deferred Musculoskeletal:     Cervical back: Normal range of motion and neck supple.     Comments: Examination of the right knee reveals no skin wounds or lesions. Mild swelling, no effusion. No warmth or erythema. Pain with palpation over the medial joint line, lateral joint line, periretinacular tissues, positive grind sign. Pain over the pes anserine insertion. Flexion contracture noted ROM 20 to 98 degrees. No ligamentous instability noted. He does have pseudolaxity with varus/valgus stress. Painless hip ROM.  Neurovascularly intact distally. Distal pulses 2+. Mild pitting pedal LE edema.   Skin:    General: Skin is warm and dry.     Capillary Refill: Capillary refill takes less than 2 seconds.  Neurological:     General: No focal deficit present.     Mental Status: He is alert and oriented to person, place, and time.  Psychiatric:        Mood and Affect: Mood normal.        Behavior: Behavior normal.        Thought Content: Thought content normal.        Judgment: Judgment normal.     Vital  signs in last 24 hours: @VSRANGES @  Labs:   Estimated body mass index is 41.65 kg/m as calculated from the following:   Height as of 09/04/20: 5' 7 (1.702 m).   Weight as of 09/04/20: 120.6 kg.   Imaging Review Plain radiographs demonstrate severe degenerative joint disease of the right knee(s). The overall alignment issignificant varus. The bone quality appears to be adequate for age and reported activity level.      Assessment/Plan:  End stage arthritis, right knee   The patient history, physical examination, clinical judgment of the provider and imaging studies are consistent with end stage degenerative joint disease of the right knee(s) and total knee arthroplasty is deemed  medically necessary. The treatment options including medical management, injection therapy arthroscopy and arthroplasty were discussed at length. The risks and benefits of total knee arthroplasty were presented and reviewed. The risks due to aseptic loosening, infection, stiffness, patella tracking problems, thromboembolic complications and other imponderables were discussed. The patient acknowledged the explanation, agreed to proceed with the plan and consent was signed. Patient is being admitted for inpatient treatment for surgery, pain control, PT, OT, prophylactic antibiotics, VTE prophylaxis, progressive ambulation and ADL's and discharge planning. The patient is planning to be discharged home with OPPT.  Therapy Plans: outpatient therapy. [facility] 07/11/24.  Disposition: Home with wife.  Planned DVT Prophylaxis: aspirin  81mg  BID DME needed: walker. Ice machine provided in office today.  PCP: Cleared.  TXA: IV Allergies:  - latex - edema, itching.  Anesthesia Concerns: None.  BMI: 32 Last HgbA1c: 5.4 Other: - Chronic low back issues/sciatica.  - Prediabetes, metformin , Ozempic. Takes on Sunday's skip next 2 doses. Stop Ozempic 14 days prior to surgery.  - Oxycodone , zofran , methocarbamol , has  meloxicam 7.5mg .  [facility].  - Labs pending.    Patient's anticipated LOS is less than 2 midnights, meeting these requirements: - Younger than 2 - Lives within 1 hour of care - Has a competent adult at home to recover with post-op recover - NO history of  - Chronic pain requiring opiods  - Diabetes  - Coronary Artery Disease  - Heart failure  - Heart attack  - Stroke  - DVT/VTE  - Cardiac arrhythmia  - Respiratory Failure/COPD  - Renal failure  - Anemia  - Advanced Liver disease      [1]  Allergies Allergen Reactions   Latex Itching, Swelling and Rash    SWELLING REACTION UNSPECIFIED

## 2024-06-22 NOTE — H&P (Signed)
 TOTAL KNEE ADMISSION H&P  Patient is being admitted for right total knee arthroplasty.  Subjective:  Chief Complaint:right knee pain.  HPI: Jonathon Doyle, 64 y.o. male, has a history of pain and functional disability in the right knee due to arthritis and has failed non-surgical conservative treatments for greater than 12 weeks to includeNSAID's and/or analgesics, corticosteriod injections, viscosupplementation injections, flexibility and strengthening excercises, use of assistive devices, and activity modification.  Onset of symptoms was gradual, starting >10 years ago with rapidlly worsening course since that time. The patient noted no past surgery on the right knee(s).  Patient currently rates pain in the right knee(s) at 10 out of 10 with activity. Patient has night pain, worsening of pain with activity and weight bearing, pain that interferes with activities of daily living, pain with passive range of motion, crepitus, and joint swelling.  Patient has evidence of subchondral cysts, subchondral sclerosis, periarticular osteophytes, joint subluxation, joint space narrowing, and bone loss by imaging studies. There is no active infection.  Patient Active Problem List   Diagnosis Date Noted   Cervical myelopathy (HCC) 09/05/2020   Primary osteoarthritis of hip 10/06/2017   Diabetes mellitus without complication (HCC) 09/16/2017   Primary osteoarthritis of right hip 09/16/2017   Past Medical History:  Diagnosis Date   Acid reflux    Arthritis    per patient all over, neck, back, hands, knees   Asthma    per patient as a kid and hasn't had any problems since then   Diabetes mellitus without complication (HCC)    Hypertension    Obesity     Past Surgical History:  Procedure Laterality Date   ANTERIOR CERVICAL DECOMPRESSION/DISCECTOMY FUSION 4 LEVELS N/A 09/05/2020   Procedure: ANTERIOR CERVICAL DECOMPRESSION/DISCECTOMY FUSION FOUR LEVELS CERVICAL THREE THROUGH SEVEN;  Surgeon: Burnetta Aures, MD;  Location: [facility] OR;  Service: Orthopedics;  Laterality: N/A;  5 hrs   COLONOSCOPY     EYE SURGERY Bilateral 2014   cataract surgery   TONSILLECTOMY     TOTAL HIP ARTHROPLASTY Right 10/06/2017   Procedure: TOTAL HIP ARTHROPLASTY ANTERIOR APPROACH;  Surgeon: Beverley Evalene BIRCH, MD;  Location: [facility] OR;  Service: Orthopedics;  Laterality: Right;    Current Outpatient Medications  Medication Sig Dispense Refill Last Dose/Taking   acetaminophen  (TYLENOL ) 500 MG tablet Take 1,000 mg by mouth daily.      Calcium  Carbonate Antacid (TUMS PO) Take 1 tablet by mouth daily as needed (heartburn).      Cyanocobalamin (VITAMIN B-12) 3000 MCG SUBL Take 3,000 mcg by mouth daily. (Patient not taking: Reported on 06/22/2024)      cyclobenzaprine (FLEXERIL) 10 MG tablet Take 10 mg by mouth at bedtime.      gabapentin  (NEURONTIN ) 300 MG capsule Take 300 mg by mouth at bedtime. (Patient not taking: Reported on 06/22/2024)      lisinopril  (ZESTRIL ) 10 MG tablet Take 10 mg by mouth daily.      loratadine (CLARITIN) 10 MG tablet Take 10 mg by mouth daily as needed (ALLERGIES DURING SPRING MONTHS). (Patient not taking: Reported on 06/22/2024)      meloxicam (MOBIC) 15 MG tablet Take 15 mg by mouth daily.      metFORMIN  (GLUCOPHAGE ) 850 MG tablet Take 850 mg by mouth daily with breakfast. with food  0    ondansetron  (ZOFRAN ) 4 MG tablet Take 1 tablet (4 mg total) by mouth every 8 (eight) hours as needed for nausea or vomiting. (Patient not taking: Reported on 06/22/2024) 20  tablet 0    OZEMPIC, 2 MG/DOSE, 8 MG/3ML SOPN Inject 2 mg into the skin once a week.      pioglitazone  (ACTOS ) 15 MG tablet Take 15 mg by mouth in the morning. (Patient not taking: Reported on 06/22/2024)  0    rosuvastatin  (CRESTOR ) 10 MG tablet Take 10 mg by mouth in the morning.  0    No current facility-administered medications for this visit.   Allergies[1]  Social History   Tobacco Use   Smoking status: Never   Smokeless tobacco: Former     Types: Chew  Substance Use Topics   Alcohol use: Yes    Comment: occasionally    No family history on file.   Review of Systems  Musculoskeletal:  Positive for arthralgias, gait problem and joint swelling.  All other systems reviewed and are negative.   Objective:  Physical Exam Constitutional:      Appearance: Normal appearance.  HENT:     Head: Normocephalic and atraumatic.     Nose: Nose normal.     Mouth/Throat:     Mouth: Mucous membranes are moist.     Pharynx: Oropharynx is clear.  Eyes:     Conjunctiva/sclera: Conjunctivae normal.  Cardiovascular:     Rate and Rhythm: Normal rate and regular rhythm.     Pulses: Normal pulses.     Heart sounds: Normal heart sounds.  Pulmonary:     Effort: Pulmonary effort is normal.     Breath sounds: Normal breath sounds.  Abdominal:     General: Abdomen is flat.     Palpations: Abdomen is soft.  Genitourinary:    Comments: deferred Musculoskeletal:     Cervical back: Normal range of motion and neck supple.     Comments: Examination of the right knee reveals no skin wounds or lesions. Mild swelling, no effusion. No warmth or erythema. Pain with palpation over the medial joint line, lateral joint line, periretinacular tissues, positive grind sign. Pain over the pes anserine insertion. Flexion contracture noted ROM 20 to 98 degrees. No ligamentous instability noted. He does have pseudolaxity with varus/valgus stress. Painless hip ROM.  Neurovascularly intact distally. Distal pulses 2+. Mild pitting pedal LE edema.   Skin:    General: Skin is warm and dry.     Capillary Refill: Capillary refill takes less than 2 seconds.  Neurological:     General: No focal deficit present.     Mental Status: He is alert and oriented to person, place, and time.  Psychiatric:        Mood and Affect: Mood normal.        Behavior: Behavior normal.        Thought Content: Thought content normal.        Judgment: Judgment normal.     Vital  signs in last 24 hours: @VSRANGES @  Labs:   Estimated body mass index is 41.65 kg/m as calculated from the following:   Height as of 09/04/20: 5' 7 (1.702 m).   Weight as of 09/04/20: 120.6 kg.   Imaging Review Plain radiographs demonstrate severe degenerative joint disease of the right knee(s). The overall alignment issignificant varus. The bone quality appears to be adequate for age and reported activity level.      Assessment/Plan:  End stage arthritis, right knee   The patient history, physical examination, clinical judgment of the provider and imaging studies are consistent with end stage degenerative joint disease of the right knee(s) and total knee arthroplasty is deemed  medically necessary. The treatment options including medical management, injection therapy arthroscopy and arthroplasty were discussed at length. The risks and benefits of total knee arthroplasty were presented and reviewed. The risks due to aseptic loosening, infection, stiffness, patella tracking problems, thromboembolic complications and other imponderables were discussed. The patient acknowledged the explanation, agreed to proceed with the plan and consent was signed. Patient is being admitted for inpatient treatment for surgery, pain control, PT, OT, prophylactic antibiotics, VTE prophylaxis, progressive ambulation and ADL's and discharge planning. The patient is planning to be discharged home with OPPT.  Therapy Plans: outpatient therapy. [facility] PT 07/11/24.  Disposition: Home with wife.  Planned DVT Prophylaxis: aspirin  81mg  BID DME needed: walker. Ice machine provided in office today.  PCP: Cleared.  TXA: IV Allergies:  - latex - edema, itching.  Anesthesia Concerns: None.  BMI: 32 Last HgbA1c: 5.4 Other: - Chronic low back issues/sciatica.  - Prediabetes, metformin , Ozempic. Takes on Sunday's skip next 2 doses. Stop Ozempic 14 days prior to surgery.  - Oxycodone , zofran , methocarbamol , has  meloxicam 7.5mg .  [facility].  - Labs pending.    Patient's anticipated LOS is less than 2 midnights, meeting these requirements: - Younger than 2 - Lives within 1 hour of care - Has a competent adult at home to recover with post-op recover - NO history of  - Chronic pain requiring opiods  - Diabetes  - Coronary Artery Disease  - Heart failure  - Heart attack  - Stroke  - DVT/VTE  - Cardiac arrhythmia  - Respiratory Failure/COPD  - Renal failure  - Anemia  - Advanced Liver disease      [1]  Allergies Allergen Reactions   Latex Itching, Swelling and Rash    SWELLING REACTION UNSPECIFIED

## 2024-06-24 NOTE — Patient Instructions (Signed)
 SURGICAL WAITING ROOM VISITATION  Patients having surgery or a procedure may have no more than 2 support people in the waiting area - these visitors may rotate.    Children ages 29 and under will not be able to visit patients in [facility] under most circumstances.   Visitors with respiratory illnesses are discouraged from visiting and should remain at home.  If the patient needs to stay at the hospital during part of their recovery, the visitor guidelines for inpatient rooms apply. Pre-op nurse will coordinate an appropriate time for 1 support person to accompany patient in pre-op.  This support person may not rotate.    Please refer to the [facility] website for the visitor guidelines for Inpatients (after your surgery is over and you are in a regular room).    Your procedure is scheduled on: 07/06/24   Report to [facility] Main Entrance    Report to admitting at 6:00 AM   Call this number if you have problems the morning of surgery [phone]   Do not eat food :After Midnight.   After Midnight you may have the following liquids until 5:30 AM DAY OF SURGERY  Water Non-Citrus Juices (without pulp, NO RED-Apple, White grape, White cranberry) Black Coffee (NO MILK/CREAM OR CREAMERS, sugar ok)  Clear Tea (NO MILK/CREAM OR CREAMERS, sugar ok) regular and decaf                             Plain Jell-O (NO RED)                                           Fruit ices (not with fruit pulp, NO RED)                                     Popsicles (NO RED)                                                               Sports drinks like Gatorade (NO RED)                  The day of surgery:  Drink ONE (1) Pre-Surgery G2 at 5:30 AM the morning of surgery. Drink in one sitting. Do not sip.  This drink was given to you during your hospital  pre-op appointment visit. Nothing else to drink after completing the  Pre-Surgery G2.          If you have questions, please contact  your surgeons office.   FOLLOW BOWEL PREP AND ANY ADDITIONAL PRE OP INSTRUCTIONS YOU RECEIVED FROM YOUR SURGEON'S OFFICE!!!     Oral Hygiene is also important to reduce your risk of infection.                                    Remember - BRUSH YOUR TEETH THE MORNING OF SURGERY WITH YOUR REGULAR TOOTHPASTE  DENTURES WILL BE REMOVED PRIOR TO SURGERY PLEASE DO NOT APPLY Poly  grip OR ADHESIVES!!!   Stop all vitamins and herbal supplements 7 days before surgery.   Take these medicines the morning of surgery with A SIP OF WATER: Tylenol , Rosuvastatin    DO NOT TAKE ANY ORAL DIABETIC MEDICATIONS DAY OF YOUR SURGERY  How to Manage Your Diabetes Before and After Surgery  Why is it important to control my blood sugar before and after surgery? Improving blood sugar levels before and after surgery helps healing and can limit problems. A way of improving blood sugar control is eating a healthy diet by:  Eating less sugar and carbohydrates  Increasing activity/exercise  Talking with your doctor about reaching your blood sugar goals High blood sugars (greater than 180 mg/dL) can raise your risk of infections and slow your recovery, so you will need to focus on controlling your diabetes during the weeks before surgery. Make sure that the doctor who takes care of your diabetes knows about your planned surgery including the date and location.  How do I manage my blood sugar before surgery? Check your blood sugar at least 4 times a day, starting 2 days before surgery, to make sure that the level is not too high or low. Check your blood sugar the morning of your surgery when you wake up and every 2 hours until you get to the Short Stay unit. If your blood sugar is less than 70 mg/dL, you will need to treat for low blood sugar: Do not take insulin . Treat a low blood sugar (less than 70 mg/dL) with  cup of clear juice (cranberry or apple), 4 glucose tablets, OR glucose gel. Recheck blood sugar in 15  minutes after treatment (to make sure it is greater than 70 mg/dL). If your blood sugar is not greater than 70 mg/dL on recheck, call [phone] for further instructions. Report your blood sugar to the short stay nurse when you get to Short Stay.  If you are admitted to the hospital after surgery: Your blood sugar will be checked by the staff and you will probably be given insulin  after surgery (instead of oral diabetes medicines) to make sure you have good blood sugar levels. The goal for blood sugar control after surgery is 80-180 mg/dL.   WHAT DO I DO ABOUT MY DIABETES MEDICATION?  Do not take oral diabetes medicines (pills) the morning of surgery.  Do not take Ozempic after 06/28/24.    DO NOT TAKE THE FOLLOWING 7 DAYS PRIOR TO SURGERY: Ozempic, Wegovy, Rybelsus (Semaglutide), Byetta (exenatide), Bydureon (exenatide ER), Victoza, Saxenda (liraglutide), or Trulicity (dulaglutide) Mounjaro (Tirzepatide) Adlyxin (Lixisenatide), Polyethylene Glycol Loxenatide.   Reviewed and Endorsed by [facility] Patient Education Committee, August 2015                              You may not have any metal on your body including jewelry, and body piercing             Do not wear lotions, powders, cologne, or deodorant              Men may shave face and neck.   Do not bring valuables to the hospital. [facility] IS NOT             RESPONSIBLE   FOR VALUABLES.   Contacts, glasses, dentures or bridgework may not be worn into surgery.   Bring small overnight bag day of surgery.   DO NOT BRING YOUR HOME MEDICATIONS TO THE  HOSPITAL. PHARMACY WILL DISPENSE MEDICATIONS LISTED ON YOUR MEDICATION LIST TO YOU DURING YOUR ADMISSION IN THE HOSPITAL!    Patients discharged on the day of surgery will not be allowed to drive home.  Someone NEEDS to stay with you for the first 24 hours after anesthesia.              Please read over the following fact sheets you were given: IF YOU HAVE QUESTIONS ABOUT YOUR  PRE-OP INSTRUCTIONS PLEASE CALL [phone]-Jonathon [phone].   If you received a COVID test during your pre-op visit  it is requested that you wear a mask when out in public, stay away from anyone that may not be feeling well and notify your surgeon if you develop symptoms. If you test positive for Covid or have been in contact with anyone that has tested positive in the last 10 days please notify you surgeon.      Pre-operative 4 CHG Bath Instructions  DYNA-Hex 4 Chlorhexidine  Gluconate 4% Solution Antiseptic 4 fl. oz   You can play a key role in reducing the risk of infection after surgery. Your skin needs to be as free of germs as possible. You can reduce the number of germs on your skin by washing with CHG (chlorhexidine  gluconate) soap before surgery. CHG is an antiseptic soap that kills germs and continues to kill germs even after washing.   DO NOT use if you have an allergy to chlorhexidine /CHG or antibacterial soaps. If your skin becomes reddened or irritated, stop using the CHG and notify one of our RNs at   Please shower with the CHG soap starting 4 days before surgery using the following schedule:     Please keep in mind the following:  DO NOT shave, including legs and underarms, starting the day of your first shower.   You may shave your face at any point before/day of surgery.  Place clean sheets on your bed the day you start using CHG soap. Use a clean washcloth (not used since being washed) for each shower. DO NOT sleep with pets once you start using the CHG.  CHG Shower Instructions:  If you choose to wash your hair and private area, wash first with your normal shampoo/soap.  After you use shampoo/soap, rinse your hair and body thoroughly to remove shampoo/soap residue.  Turn the water OFF and apply about 3 tablespoons (45 ml) of CHG soap to a CLEAN washcloth.  Apply CHG soap ONLY FROM YOUR NECK DOWN TO YOUR TOES (washing for 3-5 minutes)  DO NOT use CHG soap on face, private  areas, open wounds, or sores.  Pay special attention to the area where your surgery is being performed.  If you are having back surgery, having someone wash your back for you may be helpful. Wait 2 minutes after CHG soap is applied, then you may rinse off the CHG soap.  Pat dry with a clean towel  Put on clean clothes/pajamas   If you choose to wear lotion, please use ONLY the CHG-compatible lotions on the back of this paper.     Additional instructions for the day of surgery: DO NOT APPLY any lotions, deodorants, cologne, or perfumes.   Put on clean/comfortable clothes.  Brush your teeth.  Ask your nurse before applying any prescription medications to the skin.   CHG Compatible Lotions   Aveeno Moisturizing lotion  Cetaphil Moisturizing Cream  Cetaphil Moisturizing Lotion  Clairol Herbal Essence Moisturizing Lotion, Dry Skin  Clairol Herbal Essence Moisturizing Lotion, Extra  Dry Skin  Clairol Herbal Essence Moisturizing Lotion, Normal Skin  Curel Age Defying Therapeutic Moisturizing Lotion with Alpha Hydroxy  Curel Extreme Care Body Lotion  Curel Soothing Hands Moisturizing Hand Lotion  Curel Therapeutic Moisturizing Cream, Fragrance-Free  Curel Therapeutic Moisturizing Lotion, Fragrance-Free  Curel Therapeutic Moisturizing Lotion, Original Formula  Eucerin Daily Replenishing Lotion  Eucerin Dry Skin Therapy Plus Alpha Hydroxy Crme  Eucerin Dry Skin Therapy Plus Alpha Hydroxy Lotion  Eucerin Original Crme  Eucerin Original Lotion  Eucerin Plus Crme Eucerin Plus Lotion  Eucerin TriLipid Replenishing Lotion  Keri Anti-Bacterial Hand Lotion  Keri Deep Conditioning Original Lotion Dry Skin Formula Softly Scented  Keri Deep Conditioning Original Lotion, Fragrance Free Sensitive Skin Formula  Keri Lotion Fast Absorbing Fragrance Free Sensitive Skin Formula  Keri Lotion Fast Absorbing Softly Scented Dry Skin Formula  Keri Original Lotion  Keri Skin Renewal Lotion Keri Silky  Smooth Lotion  Keri Silky Smooth Sensitive Skin Lotion  Nivea Body Creamy Conditioning Oil  Nivea Body Extra Enriched Lotion  Nivea Body Original Lotion  Nivea Body Sheer Moisturizing Lotion Nivea Crme  Nivea Skin Firming Lotion  NutraDerm 30 Skin Lotion  NutraDerm Skin Lotion  NutraDerm Therapeutic Skin Cream  NutraDerm Therapeutic Skin Lotion  ProShield Protective Hand Cream  Provon moisturizing lotion  View Pre-Surgery Education Videos:  [email or web address]     Incentive Spirometer  An incentive spirometer is a tool that can help keep your lungs clear and active. This tool measures how well you are filling your lungs with each breath. Taking long deep breaths may help reverse or decrease the chance of developing breathing (pulmonary) problems (especially infection) following: A long period of time when you are unable to move or be active. BEFORE THE PROCEDURE  If the spirometer includes an indicator to show your best effort, your nurse or respiratory therapist will set it to a desired goal. If possible, sit up straight or lean slightly forward. Try not to slouch. Hold the incentive spirometer in an upright position. INSTRUCTIONS FOR USE  Sit on the edge of your bed if possible, or sit up as far as you can in bed or on a chair. Hold the incentive spirometer in an upright position. Breathe out normally. Place the mouthpiece in your mouth and seal your lips tightly around it. Breathe in slowly and as deeply as possible, raising the piston or the ball toward the top of the column. Hold your breath for 3-5 seconds or for as long as possible. Allow the piston or ball to fall to the bottom of the column. Remove the mouthpiece from your mouth and breathe out normally. Rest for a few seconds and repeat Steps 1 through 7 at least 10 times every 1-2 hours when you are awake. Take your time and take a few normal breaths between  deep breaths. The spirometer may include an indicator to show your best effort. Use the indicator as a goal to work toward during each repetition. After each set of 10 deep breaths, practice coughing to be sure your lungs are clear. If you have an incision (the cut made at the time of surgery), support your incision when coughing by placing a pillow or rolled up towels firmly against it. Once you are able to get out of bed, walk around indoors and cough well. You may stop using the incentive spirometer when instructed by your caregiver.  RISKS AND COMPLICATIONS Take your time so you do not get dizzy or light-headed. If you are in pain,  you may need to take or ask for pain medication before doing incentive spirometry. It is harder to take a deep breath if you are having pain. AFTER USE Rest and breathe slowly and easily. It can be helpful to keep track of a log of your progress. Your caregiver can provide you with a simple table to help with this. If you are using the spirometer at home, follow these instructions: SEEK MEDICAL CARE IF:  You are having difficultly using the spirometer. You have trouble using the spirometer as often as instructed. Your pain medication is not giving enough relief while using the spirometer. You develop fever of 100.5 F (38.1 C) or higher. SEEK IMMEDIATE MEDICAL CARE IF:  You cough up bloody sputum that had not been present before. You develop fever of 102 F (38.9 C) or greater. You develop worsening pain at or near the incision site. MAKE SURE YOU:  Understand these instructions. Will watch your condition. Will get help right away if you are not doing well or get worse. Document Released: 10/13/2006 Document Revised: 08/25/2011 Document Reviewed: 12/14/2006 [facility] Patient Information 2014 [place], [place].   ________________________________________________________________________

## 2024-06-24 NOTE — Progress Notes (Signed)
 Date of COVID positive in last 90 days:  PCP - Elspeth Senters Cardiologist -   Clearance in media tab dated 06/03/24  Chest x-ray - N/A EKG - 06/27/24 Epic/chart Stress Test - N/A ECHO - N/A Cardiac Cath - N/A Pacemaker/ICD device last checked:N/A Spinal Cord Stimulator:N/A  Bowel Prep - N/A  Sleep Study - N/A CPAP -   Fasting Blood Sugar - Checks Blood Sugar no checks at home  Last dose of GLP1 agonist-  Ozempic GLP1 instructions:  Do not take after  06/28/24   Last dose of SGLT-2 inhibitors-  N/A SGLT-2 instructions:  Do not take after     Blood Thinner Instructions: N/A Last dose:   Time: Aspirin  Instructions:N/A Last Dose:  Activity level: Can go up a flight of stairs and perform activities of daily living without stopping and without symptoms of chest pain or shortness of breath. Very difficult to do stairs due to knee pain  Anesthesia review: ST wave abn and PVC on EKG  Patient denies shortness of breath, fever, cough and chest pain at PAT appointment  Patient verbalized understanding of instructions that were given to them at the PAT appointment. Patient was also instructed that they will need to review over the PAT instructions again at home before surgery.

## 2024-06-27 ENCOUNTER — Other Ambulatory Visit: Payer: Self-pay

## 2024-06-27 ENCOUNTER — Encounter (HOSPITAL_COMMUNITY): Payer: Self-pay

## 2024-06-27 ENCOUNTER — Encounter (HOSPITAL_COMMUNITY)
Admission: RE | Admit: 2024-06-27 | Discharge: 2024-06-27 | Disposition: A | Source: Ambulatory Visit | Attending: Orthopedic Surgery

## 2024-06-27 VITALS — BP 152/84 | HR 67 | Temp 98.4°F | Resp 14 | Ht 67.0 in

## 2024-06-27 DIAGNOSIS — E119 Type 2 diabetes mellitus without complications: Secondary | ICD-10-CM | POA: Insufficient documentation

## 2024-06-27 DIAGNOSIS — Z01818 Encounter for other preprocedural examination: Secondary | ICD-10-CM | POA: Diagnosis not present

## 2024-06-27 DIAGNOSIS — Z01810 Encounter for preprocedural cardiovascular examination: Secondary | ICD-10-CM | POA: Diagnosis present

## 2024-06-27 DIAGNOSIS — Z01812 Encounter for preprocedural laboratory examination: Secondary | ICD-10-CM | POA: Diagnosis present

## 2024-06-27 LAB — CBC
HCT: 42.5 % (ref 39.0–52.0)
Hemoglobin: 14.2 g/dL (ref 13.0–17.0)
MCH: 30 pg (ref 26.0–34.0)
MCHC: 33.4 g/dL (ref 30.0–36.0)
MCV: 89.9 fL (ref 80.0–100.0)
Platelets: 228 K/uL (ref 150–400)
RBC: 4.73 MIL/uL (ref 4.22–5.81)
RDW: 13.2 % (ref 11.5–15.5)
WBC: 6.8 K/uL (ref 4.0–10.5)
nRBC: 0 % (ref 0.0–0.2)

## 2024-06-27 LAB — SURGICAL PCR SCREEN
MRSA, PCR: NEGATIVE
Staphylococcus aureus: NEGATIVE

## 2024-06-27 LAB — BASIC METABOLIC PANEL WITH GFR
Anion gap: 9 (ref 5–15)
BUN: 22 mg/dL (ref 8–23)
CO2: 26 mmol/L (ref 22–32)
Calcium: 9.8 mg/dL (ref 8.9–10.3)
Chloride: 102 mmol/L (ref 98–111)
Creatinine, Ser: 0.85 mg/dL (ref 0.61–1.24)
GFR, Estimated: >60 mL/min
Glucose, Bld: 94 mg/dL (ref 70–99)
Potassium: 4.7 mmol/L (ref 3.5–5.1)
Sodium: 138 mmol/L (ref 135–145)

## 2024-06-27 LAB — GLUCOSE, CAPILLARY: Glucose-Capillary: 84 mg/dL (ref 70–99)

## 2024-06-27 LAB — HEMOGLOBIN A1C
Hgb A1c MFr Bld: 5.4 % (ref 4.8–5.6)
Mean Plasma Glucose: 108.28 mg/dL

## 2024-06-29 NOTE — Anesthesia Preprocedure Evaluation (Addendum)
"                                    Anesthesia Evaluation  Patient identified by MRN, date of birth, ID band Patient awake    Reviewed: Allergy & Precautions, H&P , NPO status , Patient's Chart, lab work & pertinent test results  Airway Mallampati: II   Neck ROM: full    Dental   Pulmonary asthma    breath sounds clear to auscultation       Cardiovascular hypertension,  Rhythm:regular Rate:Normal     Neuro/Psych    GI/Hepatic ,GERD  ,,  Endo/Other  diabetes, Type 2    Renal/GU      Musculoskeletal  (+) Arthritis ,    Abdominal   Peds  Hematology   Anesthesia Other Findings   Reproductive/Obstetrics                              Anesthesia Physical Anesthesia Plan  ASA: 2  Anesthesia Plan: MAC and Spinal   Post-op Pain Management: Regional block*   Induction: Intravenous  PONV Risk Score and Plan: 1 and Propofol  infusion, Treatment may vary due to age or medical condition and Midazolam   Airway Management Planned: Simple Face Mask  Additional Equipment:   Intra-op Plan:   Post-operative Plan:   Informed Consent: I have reviewed the patients History and Physical, chart, labs and discussed the procedure including the risks, benefits and alternatives for the proposed anesthesia with the patient or authorized representative who has indicated his/her understanding and acceptance.     Dental advisory given  Plan Discussed with: CRNA, Anesthesiologist and Surgeon  Anesthesia Plan Comments: (64 yo with HTN, HLD, DM. Cleared from medical/cardiac perspective by PCP in November 2025. Labs, EKG WNL)         Anesthesia Quick Evaluation  "

## 2024-07-06 ENCOUNTER — Ambulatory Visit (HOSPITAL_COMMUNITY): Payer: Self-pay | Admitting: Medical

## 2024-07-06 ENCOUNTER — Ambulatory Visit (HOSPITAL_BASED_OUTPATIENT_CLINIC_OR_DEPARTMENT_OTHER): Admitting: Registered Nurse

## 2024-07-06 ENCOUNTER — Ambulatory Visit (HOSPITAL_COMMUNITY)
Admission: RE | Admit: 2024-07-06 | Discharge: 2024-07-06 | Disposition: A | Discharge status: Home / Self Care | Attending: Orthopedic Surgery | Admitting: Orthopedic Surgery

## 2024-07-06 ENCOUNTER — Other Ambulatory Visit: Payer: Self-pay

## 2024-07-06 ENCOUNTER — Other Ambulatory Visit (HOSPITAL_COMMUNITY): Payer: Self-pay

## 2024-07-06 ENCOUNTER — Encounter (HOSPITAL_COMMUNITY): Payer: Self-pay | Admitting: Orthopedic Surgery

## 2024-07-06 ENCOUNTER — Encounter (HOSPITAL_COMMUNITY)
Admission: RE | Disposition: A | Discharge status: Home / Self Care | Payer: Self-pay | Source: Home / Self Care | Attending: Orthopedic Surgery

## 2024-07-06 ENCOUNTER — Ambulatory Visit (HOSPITAL_COMMUNITY)

## 2024-07-06 DIAGNOSIS — I10 Essential (primary) hypertension: Secondary | ICD-10-CM | POA: Diagnosis not present

## 2024-07-06 DIAGNOSIS — Z7985 Long-term (current) use of injectable non-insulin antidiabetic drugs: Secondary | ICD-10-CM | POA: Diagnosis not present

## 2024-07-06 DIAGNOSIS — F17220 Nicotine dependence, chewing tobacco, uncomplicated: Secondary | ICD-10-CM | POA: Insufficient documentation

## 2024-07-06 DIAGNOSIS — M1711 Unilateral primary osteoarthritis, right knee: Secondary | ICD-10-CM | POA: Diagnosis present

## 2024-07-06 DIAGNOSIS — Z7984 Long term (current) use of oral hypoglycemic drugs: Secondary | ICD-10-CM | POA: Insufficient documentation

## 2024-07-06 DIAGNOSIS — E119 Type 2 diabetes mellitus without complications: Secondary | ICD-10-CM | POA: Diagnosis not present

## 2024-07-06 DIAGNOSIS — Z96651 Presence of right artificial knee joint: Secondary | ICD-10-CM

## 2024-07-06 HISTORY — PX: KNEE ARTHROPLASTY: SHX992

## 2024-07-06 LAB — GLUCOSE, CAPILLARY
Glucose-Capillary: 89 mg/dL (ref 70–99)
Glucose-Capillary: 96 mg/dL (ref 70–99)

## 2024-07-06 MED ORDER — LACTATED RINGERS IV SOLN: INTRAVENOUS | Status: DC

## 2024-07-06 MED ORDER — PROPOFOL 500 MG/50ML IV EMUL
INTRAVENOUS | Status: DC | PRN
  Administered 2024-07-06: 4 ug/kg/min via INTRAVENOUS
  Administered 2024-07-06: 40 mg via INTRAVENOUS
  Administered 2024-07-06: 20 mg via INTRAVENOUS

## 2024-07-06 MED ORDER — ONDANSETRON HCL 4 MG/2ML IJ SOLN: 4.0000 mg | Freq: Four times a day (QID) | INTRAMUSCULAR | Status: DC | PRN

## 2024-07-06 MED ORDER — FENTANYL CITRATE (PF) 50 MCG/ML IJ SOSY: 25.0000 ug | PREFILLED_SYRINGE | INTRAMUSCULAR | Status: DC | PRN

## 2024-07-06 MED ORDER — FENTANYL CITRATE (PF) 100 MCG/2ML IJ SOLN
INTRAMUSCULAR | Status: AC
  Filled 2024-07-06: qty 2

## 2024-07-06 MED ORDER — ORAL CARE MOUTH RINSE: 15.0000 mL | Freq: Once | OROMUCOSAL | Status: AC

## 2024-07-06 MED ORDER — KETOROLAC TROMETHAMINE 15 MG/ML IJ SOLN: 15.0000 mg | Freq: Four times a day (QID) | INTRAMUSCULAR | Status: DC

## 2024-07-06 MED ORDER — INSULIN ASPART 100 UNIT/ML IJ SOLN: 0.0000 [IU] | INTRAMUSCULAR | Status: DC | PRN

## 2024-07-06 MED ORDER — LACTATED RINGERS IV BOLUS: 500.0000 mL | Freq: Once | INTRAVENOUS | Status: DC

## 2024-07-06 MED ORDER — ACETAMINOPHEN 500 MG PO TABS: 1000.0000 mg | ORAL_TABLET | Freq: Four times a day (QID) | ORAL | Status: DC

## 2024-07-06 MED ORDER — INSULIN ASPART 100 UNIT/ML IJ SOLN: 0.0000 [IU] | Freq: Every day | INTRAMUSCULAR | Status: DC

## 2024-07-06 MED ORDER — ACETAMINOPHEN 500 MG PO TABS
1000.0000 mg | ORAL_TABLET | Freq: Three times a day (TID) | ORAL | 0 refills | Status: AC | PRN
  Filled 2024-07-06: qty 30, 5d supply, fill #0

## 2024-07-06 MED ORDER — ROPIVACAINE HCL 5 MG/ML IJ SOLN
INTRAMUSCULAR | Status: DC | PRN
  Administered 2024-07-06: 25 mL via PERINEURAL

## 2024-07-06 MED ORDER — ISOPROPYL ALCOHOL 70 % SOLN
Status: DC | PRN
  Administered 2024-07-06: 1 via TOPICAL

## 2024-07-06 MED ORDER — OXYCODONE HCL 5 MG PO TABS: 5.0000 mg | ORAL_TABLET | ORAL | Status: DC | PRN

## 2024-07-06 MED ORDER — POLYETHYLENE GLYCOL 3350 17 GM/SCOOP PO POWD
17.0000 g | Freq: Every day | ORAL | 0 refills | Status: AC | PRN
  Filled 2024-07-06: qty 238, 14d supply, fill #0

## 2024-07-06 MED ORDER — METOCLOPRAMIDE HCL 5 MG PO TABS: 5.0000 mg | ORAL_TABLET | Freq: Three times a day (TID) | ORAL | Status: DC | PRN

## 2024-07-06 MED ORDER — MIDAZOLAM HCL 5 MG/5ML IJ SOLN
INTRAMUSCULAR | Status: DC | PRN
  Administered 2024-07-06: 2 mg via INTRAVENOUS

## 2024-07-06 MED ORDER — HYDROMORPHONE HCL 1 MG/ML IJ SOLN: 0.5000 mg | INTRAMUSCULAR | Status: DC | PRN

## 2024-07-06 MED ORDER — LACTATED RINGERS IV BOLUS: 250.0000 mL | Freq: Once | INTRAVENOUS | Status: DC

## 2024-07-06 MED ORDER — TRANEXAMIC ACID-NACL 1000-0.7 MG/100ML-% IV SOLN
1000.0000 mg | INTRAVENOUS | Status: AC
  Administered 2024-07-06: 1000 mg via INTRAVENOUS
  Filled 2024-07-06: qty 100

## 2024-07-06 MED ORDER — STERILE WATER FOR IRRIGATION IR SOLN
Status: DC | PRN
  Administered 2024-07-06: 2000 mL

## 2024-07-06 MED ORDER — POVIDONE-IODINE 10 % EX SWAB: 2.0000 | Freq: Once | CUTANEOUS | Status: DC

## 2024-07-06 MED ORDER — OXYCODONE HCL 5 MG PO TABS: 10.0000 mg | ORAL_TABLET | ORAL | Status: DC | PRN

## 2024-07-06 MED ORDER — METHOCARBAMOL 1000 MG/10ML IJ SOLN: 500.0000 mg | Freq: Four times a day (QID) | INTRAMUSCULAR | Status: DC | PRN

## 2024-07-06 MED ORDER — PHENYLEPHRINE HCL-NACL 20-0.9 MG/250ML-% IV SOLN
INTRAVENOUS | Status: DC | PRN
  Administered 2024-07-06: 25 ug/min via INTRAVENOUS

## 2024-07-06 MED ORDER — ACETAMINOPHEN 500 MG PO TABS: 1000.0000 mg | ORAL_TABLET | Freq: Once | ORAL | Status: DC

## 2024-07-06 MED ORDER — OXYCODONE HCL 5 MG/5ML PO SOLN: 5.0000 mg | Freq: Once | ORAL | Status: AC | PRN

## 2024-07-06 MED ORDER — DEXAMETHASONE SOD PHOSPHATE PF 10 MG/ML IJ SOLN
INTRAMUSCULAR | Status: AC
  Filled 2024-07-06: qty 1

## 2024-07-06 MED ORDER — SODIUM CHLORIDE (PF) 0.9 % IJ SOLN
INTRAMUSCULAR | Status: DC | PRN
  Administered 2024-07-06: 61 mL

## 2024-07-06 MED ORDER — ONDANSETRON HCL 4 MG PO TABS: 4.0000 mg | ORAL_TABLET | Freq: Four times a day (QID) | ORAL | Status: DC | PRN

## 2024-07-06 MED ORDER — INSULIN ASPART 100 UNIT/ML IJ SOLN: 0.0000 [IU] | Freq: Three times a day (TID) | INTRAMUSCULAR | Status: DC

## 2024-07-06 MED ORDER — METHOCARBAMOL 500 MG PO TABS: 500.0000 mg | ORAL_TABLET | Freq: Four times a day (QID) | ORAL | Status: DC | PRN

## 2024-07-06 MED ORDER — ONDANSETRON HCL 4 MG/2ML IJ SOLN
INTRAMUSCULAR | Status: AC
  Filled 2024-07-06: qty 2

## 2024-07-06 MED ORDER — OXYCODONE HCL 5 MG PO TABS
5.0000 mg | ORAL_TABLET | ORAL | 0 refills | Status: AC | PRN
  Filled 2024-07-06: qty 40, 7d supply, fill #0

## 2024-07-06 MED ORDER — PROPOFOL 1000 MG/100ML IV EMUL
INTRAVENOUS | Status: AC
  Filled 2024-07-06: qty 100

## 2024-07-06 MED ORDER — MIDAZOLAM HCL 2 MG/2ML IJ SOLN
INTRAMUSCULAR | Status: AC
  Filled 2024-07-06: qty 2

## 2024-07-06 MED ORDER — CEFAZOLIN SODIUM-DEXTROSE 2-4 GM/100ML-% IV SOLN
2.0000 g | INTRAVENOUS | Status: AC
  Administered 2024-07-06: 2 g via INTRAVENOUS
  Filled 2024-07-06: qty 100

## 2024-07-06 MED ORDER — KETOROLAC TROMETHAMINE 30 MG/ML IJ SOLN
INTRAMUSCULAR | Status: AC
  Filled 2024-07-06: qty 1

## 2024-07-06 MED ORDER — DOCUSATE SODIUM 100 MG PO CAPS
100.0000 mg | ORAL_CAPSULE | Freq: Two times a day (BID) | ORAL | 0 refills | Status: AC
  Filled 2024-07-06: qty 60, 30d supply, fill #0

## 2024-07-06 MED ORDER — KETOROLAC TROMETHAMINE 15 MG/ML IJ SOLN
INTRAMUSCULAR | Status: AC
  Filled 2024-07-06: qty 1

## 2024-07-06 MED ORDER — CHLORHEXIDINE GLUCONATE 0.12 % MT SOLN
15.0000 mL | Freq: Once | OROMUCOSAL | Status: AC
  Administered 2024-07-06: 15 mL via OROMUCOSAL

## 2024-07-06 MED ORDER — SENNA 8.6 MG PO TABS
2.0000 | ORAL_TABLET | Freq: Every day | ORAL | 0 refills | Status: AC
  Filled 2024-07-06: qty 30, 15d supply, fill #0

## 2024-07-06 MED ORDER — FENTANYL CITRATE (PF) 100 MCG/2ML IJ SOLN
INTRAMUSCULAR | Status: DC | PRN
  Administered 2024-07-06 (×2): 25 ug via INTRAVENOUS
  Administered 2024-07-06: 50 ug via INTRAVENOUS

## 2024-07-06 MED ORDER — BUPIVACAINE IN DEXTROSE 0.75-8.25 % IT SOLN
INTRATHECAL | Status: DC | PRN
  Administered 2024-07-06: 2 mL via INTRATHECAL

## 2024-07-06 MED ORDER — CEFAZOLIN SODIUM-DEXTROSE 2-4 GM/100ML-% IV SOLN: 2.0000 g | Freq: Four times a day (QID) | INTRAVENOUS | Status: DC

## 2024-07-06 MED ORDER — OXYCODONE HCL 5 MG PO TABS
5.0000 mg | ORAL_TABLET | Freq: Once | ORAL | Status: AC | PRN
  Administered 2024-07-06: 5 mg via ORAL

## 2024-07-06 MED ORDER — SODIUM CHLORIDE (PF) 0.9 % IJ SOLN
INTRAMUSCULAR | Status: AC
  Filled 2024-07-06: qty 30

## 2024-07-06 MED ORDER — ASPIRIN 81 MG PO CHEW
81.0000 mg | CHEWABLE_TABLET | Freq: Two times a day (BID) | ORAL | 0 refills | Status: AC
  Filled 2024-07-06: qty 90, 45d supply, fill #0

## 2024-07-06 MED ORDER — METHOCARBAMOL 500 MG PO TABS
500.0000 mg | ORAL_TABLET | Freq: Four times a day (QID) | ORAL | 0 refills | Status: AC | PRN
  Filled 2024-07-06: qty 20, 5d supply, fill #0

## 2024-07-06 MED ORDER — BUPIVACAINE-EPINEPHRINE (PF) 0.25% -1:200000 IJ SOLN
INTRAMUSCULAR | Status: AC
  Filled 2024-07-06: qty 30

## 2024-07-06 MED ORDER — DEXAMETHASONE SOD PHOSPHATE PF 10 MG/ML IJ SOLN
INTRAMUSCULAR | Status: DC | PRN
  Administered 2024-07-06: 8 mg via INTRAVENOUS

## 2024-07-06 MED ORDER — OXYCODONE HCL 5 MG PO TABS
ORAL_TABLET | ORAL | Status: AC
  Filled 2024-07-06: qty 1

## 2024-07-06 MED ORDER — METOCLOPRAMIDE HCL 5 MG/ML IJ SOLN: 5.0000 mg | Freq: Three times a day (TID) | INTRAMUSCULAR | Status: DC | PRN

## 2024-07-06 MED ORDER — 0.9 % SODIUM CHLORIDE (POUR BTL) OPTIME
TOPICAL | Status: DC | PRN
  Administered 2024-07-06: 1000 mL

## 2024-07-06 MED ORDER — ONDANSETRON HCL 4 MG/2ML IJ SOLN
INTRAMUSCULAR | Status: DC | PRN
  Administered 2024-07-06: 4 mg via INTRAVENOUS

## 2024-07-06 MED ORDER — SODIUM CHLORIDE 0.9 % IR SOLN
Status: DC | PRN
  Administered 2024-07-06: 250 mL
  Administered 2024-07-06: 3000 mL

## 2024-07-06 NOTE — Transfer of Care (Signed)
 Immediate Anesthesia Transfer of Care Note  Patient: Jonathon Doyle  Procedure(s) Performed: ARTHROPLASTY, KNEE, TOTAL, USING IMAGELESS COMPUTER-ASSISTED NAVIGATION (Right: Knee)  Patient Location: PACU  Anesthesia Type:Spinal  Level of Consciousness: sedated  Airway & Oxygen Therapy: Patient Spontanous Breathing and Patient connected to face mask oxygen  Post-op Assessment: Report given to RN and Post -op Vital signs reviewed and stable  Post vital signs: Reviewed and stable  Last Vitals:  Vitals Value Taken Time  BP    Temp    Pulse 80 07/06/24 11:18  Resp 16 07/06/24 11:18  SpO2 95 % 07/06/24 11:18  Vitals shown include unfiled device data.  Last Pain:  Vitals:   07/06/24 0627  TempSrc: Oral         Complications: No notable events documented.

## 2024-07-06 NOTE — Discharge Instructions (Signed)

## 2024-07-06 NOTE — Anesthesia Procedure Notes (Addendum)
 Procedure Name: MAC Date/Time: 07/06/2024 8:30 AM  Performed by: Memory Armida LABOR, CRNAPre-anesthesia Checklist: Emergency Drugs available, Patient identified, Suction available, Patient being monitored and Timeout performed Patient Re-evaluated:Patient Re-evaluated prior to induction Oxygen Delivery Method: Simple face mask Placement Confirmation: positive ETCO2 Dental Injury: Teeth and Oropharynx as per pre-operative assessment

## 2024-07-06 NOTE — Interval H&P Note (Signed)
 History and Physical Interval Note:  07/06/2024 8:23 AM  Jonathon Doyle  has presented today for surgery, with the diagnosis of Right knee osteoarthritis.  The various methods of treatment have been discussed with the patient and family. After consideration of risks, benefits and other options for treatment, the patient has consented to  Procedures: ARTHROPLASTY, KNEE, TOTAL, USING IMAGELESS COMPUTER-ASSISTED NAVIGATION (Right) as a surgical intervention.  The patient's history has been reviewed, patient examined, no change in status, stable for surgery.  I have reviewed the patient's chart and labs.  Questions were answered to the patient's satisfaction.     Jonathon Doyle

## 2024-07-06 NOTE — Anesthesia Postprocedure Evaluation (Signed)
"   Anesthesia Post Note  Patient: Jonathon Doyle  Procedure(s) Performed: ARTHROPLASTY, KNEE, TOTAL, USING IMAGELESS COMPUTER-ASSISTED NAVIGATION (Right: Knee)     Patient location during evaluation: PACU Anesthesia Type: MAC, Spinal and Regional Level of consciousness: oriented and awake and alert Pain management: pain level controlled Vital Signs Assessment: post-procedure vital signs reviewed and stable Respiratory status: spontaneous breathing, respiratory function stable and patient connected to nasal cannula oxygen Cardiovascular status: blood pressure returned to baseline and stable Postop Assessment: no headache, no backache and no apparent nausea or vomiting Anesthetic complications: no   No notable events documented.  Last Vitals:  Vitals:   07/06/24 1315 07/06/24 1330  BP: 133/77 (!) 155/69  Pulse: 65 (!) 58  Resp: 17 17  Temp:    SpO2: 96% 95%    Last Pain:  Vitals:   07/06/24 1330  TempSrc:   PainSc: 1                  Tamantha Saline S      "

## 2024-07-06 NOTE — Op Note (Signed)
 OPERATIVE REPORT  SURGEON: Redell Shoals, MD   ASSISTANT: Valery Potters, PA-C  PREOPERATIVE DIAGNOSIS: Primary Right knee arthritis.   POSTOPERATIVE DIAGNOSIS: Primary Right knee arthritis.   PROCEDURE: Computer assisted Right total knee arthroplasty.   IMPLANTS: Zimmer Persona PPS Cementless CR femur, size 11. Persona 0 degree Spiked Keel OsseoTi Tibia, size F. Vivacit-E polyethelyene insert, size 11 mm, MC. OsseoTi 3-Peg patella, size 38 mm.  ANESTHESIA:  MAC, Regional, and Spinal  TOURNIQUET TIME: Not utilized.   ESTIMATED BLOOD LOSS:-150 mL    ANTIBIOTICS: 2g Ancef .  DRAINS: None.  COMPLICATIONS: None   CONDITION: PACU - hemodynamically stable.   BRIEF CLINICAL NOTE: Jonathon Doyle is a 64 y.o. male with a long-standing history of Right knee arthritis. After failing conservative management, the patient was indicated for total knee arthroplasty. The risks, benefits, and alternatives to the procedure were explained, and the patient elected to proceed.  PROCEDURE IN DETAIL: Adductor canal block was obtained in the pre-op holding area. Once inside the operative room, spinal anesthesia was obtained, and a foley catheter was inserted. The patient was then positioned and the lower extremity was prepped and draped in the normal sterile surgical fashion.  A time-out was called verifying side and site of surgery. The patient received IV antibiotics within 60 minutes of beginning the procedure. A tourniquet was not utilized.   An anterior approach to the knee was performed utilizing a midvastus arthrotomy. A medial release was performed and the patellar fat pad was excised. Stryker imageless navigation was used to cut the distal femur perpendicular to the mechanical axis. A freehand patellar resection was performed, and the patella was sized and prepared with 3 lug holes.  Nagivation was used to make a neutral proximal tibia resection, taking 9 mm of bone from the less affected lateral  side with 3 degrees of slope. The menisci were excised. A spacer block was placed, and the alignment and balance in extension were confirmed.   The distal femur was sized using the 3-degree external rotation guide referencing the posterior femoral cortex. The appropriate 4-in-1 cutting block was pinned into place. Rotation was checked using Whiteside's line, the epicondylar axis, and then confirmed with a spacer block in flexion. The remaining femoral cuts were performed, taking care to protect the MCL.  The tibia was sized and the trial tray was pinned into place. The remaining trail components were inserted. The knee was stable to varus and valgus stress through a full range of motion. The patella tracked centrally, and the PCL was lax so a MC bearing was utilized. The trial components were removed, and the proximal tibial surface was prepared. Final components were impacted into place. The knee was tested for a final time and found to be well balanced.   The wound was copiously irrigated with Prontosan solution and normal saline using pulse lavage.  Marcaine  solution was injected into the periarticular soft tissue.  The wound was closed in layers using #1 Vicryl and Stratafix for the fascia, 2-0 Vicryl for the subcutaneous fat, 2-0 Monocryl for the deep dermal layer, 3-0 running Monocryl subcuticular Stitch, and 4-0 Monocryl stay sutures at both ends of the wound. Dermabond was applied to the skin.  Once the glue was fully dried, an Aquacell Ag and compressive dressing were applied.  The patient was transported to the recovery room in stable condition.  Sponge, needle, and instrument counts were correct at the end of the case x2.  The patient tolerated the procedure well and  there were no known complications.  Please note that a surgical assistant was a medical necessity for this procedure in order to perform it in a safe and expeditious manner. Surgical assistant was necessary to retract the ligaments  and vital neurovascular structures to prevent injury to them and also necessary for proper positioning of the limb to allow for anatomic placement of the prosthesis.

## 2024-07-06 NOTE — Anesthesia Procedure Notes (Addendum)
 Spinal  Start time: 07/06/2024 8:32 AM End time: 07/06/2024 8:35 AM Reason for block: surgical anesthesia  Staffing Authorized by: Maryclare Cornet, MD   Performed by: Memory Armida LABOR, CRNA  Preanesthetic Checklist Completed: patient identified, IV checked, site marked, risks and benefits discussed, surgical consent, monitors and equipment checked, pre-op evaluation and timeout performed Spinal Block Patient position: sitting Prep: DuraPrep and site prepped and draped Patient monitoring: heart rate, cardiac monitor, continuous pulse ox and blood pressure Approach: midline Location: L3-4 Injection technique: single-shot Needle Needle type: Pencan  Needle gauge: 24 G Needle length: 10 cm Assessment Sensory level: T6 Events: CSF return  Additional Notes Pt placed in a sitting position for spinal placement. MOnitors on , O2 via FM and sedation for pt comfort. Spinal kit Expiration date checked and verified. Lot # [id number] .One attempt. Local applied to site. + clear free flowing CSF obtained. - heme. Pt tolerated well.

## 2024-07-06 NOTE — Evaluation (Addendum)
 "   Physical Therapy Evaluation Patient Details Name: Jonathon Doyle MRN: [id number] DOB: 17-Sep-1960 Today's Date: 07/06/2024  History of Present Illness  64 yo male presents to therapy s/p R TKA on 07/06/2024 due to failure of conservative measures. Pt PMH includes but is not limited to: cervical myelopathy s/p ACDF, OA, DM II, HTN, and R THA (2019).  Clinical Impression    Jonathon Doyle is a 64 y.o. male POD 0 s/p R TKA. Patient reports INd with mobility at baseline. Patient is now limited by functional impairments (see PT problem list below) and requires CGA for transfers and gait with RW. Patient was able to ambulate 50 feet with RW and CGA and cues for safe walker management. Patient educated on safe sequencing for stair mobility with R handrail only, fall risk prevention, slowly increasing activity level, focus on R knee extension when positioned in recliner and bed, use of ice man machine, pain management and goal and car transfers pt and spouse verbalized understanding of safe guarding position for people assisting with mobility. Patient instructed in exercises to facilitate ROM and circulation reviewed and HO provided. Patient will benefit from continued skilled PT interventions to address impairments and progress towards PLOF. Patient has met mobility goals at adequate level for discharge home with family support and OPPT services scheduled for 1/26; will continue to follow if pt continues acute stay to progress towards Mod I goals.       If plan is discharge home, recommend the following: A little help with walking and/or transfers;A little help with bathing/dressing/bathroom;Assistance with cooking/housework;Direct supervision/assist for financial management;Assist for transportation   Can travel by private vehicle        Equipment Recommendations None recommended by PT  Recommendations for Other Services       Functional Status Assessment Patient has had a recent decline in  their functional status and demonstrates the ability to make significant improvements in function in a reasonable and predictable amount of time.     Precautions / Restrictions Precautions Precautions: Knee;Fall Restrictions Weight Bearing Restrictions Per Provider Order: No      Mobility  Bed Mobility Overal bed mobility: Needs Assistance Bed Mobility: Supine to Sit     Supine to sit: Supervision, HOB elevated     General bed mobility comments: min cues    Transfers Overall transfer level: Needs assistance Equipment used: Rolling walker (2 wheels) Transfers: Sit to/from Stand Sit to Stand: Contact guard assist           General transfer comment: min cues    Ambulation/Gait Ambulation/Gait assistance: Contact guard assist Gait Distance (Feet): 50 Feet Assistive device: Rolling walker (2 wheels) Gait Pattern/deviations: Step-to pattern, Decreased stance time - right, Antalgic, Trunk flexed Gait velocity: decreased     General Gait Details: slight trunk flexion with B UE support at RW to offload R LE in stance phase, min cues for safety, RW management and attention to R foot placement with toe in noted  Stairs Stairs: Yes Stairs assistance: Contact guard assist Stair Management: Two rails, One rail Right Number of Stairs: 3 General stair comments: step navigation initiated with B handrails and cues for safety, sequencing and step to technique pt able to progress to CGA and R handrail only  Wheelchair Mobility     Tilt Bed    Modified Rankin (Stroke Patients Only)       Balance Overall balance assessment: Needs assistance Sitting-balance support: Feet supported Sitting balance-Leahy Scale: Good  Standing balance support: Bilateral upper extremity supported, During functional activity, Reliant on assistive device for balance Standing balance-Leahy Scale: Poor                               Pertinent Vitals/Pain Pain Assessment Pain  Assessment: 0-10 Pain Score: 3  Pain Location: R knee and LE Pain Descriptors / Indicators: Aching, Constant, Discomfort, Grimacing, Operative site guarding Pain Intervention(s): Limited activity within patient's tolerance, Monitored during session, Premedicated before session, Repositioned, Ice applied    Home Living Family/patient expects to be discharged to:: Private residence Living Arrangements: Spouse/significant other Available Help at Discharge: Family Type of Home: House Home Access: Stairs to enter Entrance Stairs-Rails: Right Entrance Stairs-Number of Steps: 2   Home Layout: One level Home Equipment: Agricultural Consultant (2 wheels);Cane - quad;BSC/3in1      Prior Function Prior Level of Function : Driving;Independent/Modified Independent             Mobility Comments: IND no AD for all ADLs self care tasks and IADLs       Extremity/Trunk Assessment        Lower Extremity Assessment Lower Extremity Assessment: RLE deficits/detail RLE Deficits / Details: ankle DF/PF 5/5; SLR < 10 degree lag RLE Sensation: history of peripheral neuropathy;decreased proprioception;decreased light touch    Cervical / Trunk Assessment Cervical / Trunk Assessment: Neck Surgery  Communication   Communication Communication: No apparent difficulties    Cognition Arousal: Alert Behavior During Therapy: WFL for tasks assessed/performed   PT - Cognitive impairments: No apparent impairments                         Following commands: Intact       Cueing       General Comments      Exercises Total Joint Exercises Ankle Circles/Pumps: AROM, Both, 10 reps Quad Sets: AROM, Right, 5 reps Short Arc Quad: AROM, Right, 5 reps Heel Slides: AROM, Right, 5 reps Hip ABduction/ADduction: AROM, Right, 5 reps Straight Leg Raises: AROM, Right, 5 reps Knee Flexion: AROM, Right, 5 reps   Assessment/Plan    PT Assessment Patient needs continued PT services  PT Problem List  Decreased strength;Decreased range of motion;Decreased activity tolerance;Decreased balance;Decreased mobility;Decreased coordination;Pain       PT Treatment Interventions DME instruction;Gait training;Stair training;Functional mobility training;Therapeutic activities;Therapeutic exercise;Balance training;Neuromuscular re-education;Patient/family education;Modalities    PT Goals (Current goals can be found in the Care Plan section)  Acute Rehab PT Goals Patient Stated Goal: to be able to walk more normally and no pain PT Goal Formulation: With patient Time For Goal Achievement: 07/20/24 Potential to Achieve Goals: Good    Frequency 7X/week     Co-evaluation               AM-PAC PT 6 Clicks Mobility  Outcome Measure Help needed turning from your back to your side while in a flat bed without using bedrails?: None Help needed moving from lying on your back to sitting on the side of a flat bed without using bedrails?: A Little Help needed moving to and from a bed to a chair (including a wheelchair)?: A Little Help needed standing up from a chair using your arms (e.g., wheelchair or bedside chair)?: A Little Help needed to walk in hospital room?: A Little Help needed climbing 3-5 steps with a railing? : A Little 6 Click Score: 19    End of  Session Equipment Utilized During Treatment: Gait belt Activity Tolerance: No increased pain;Patient tolerated treatment well Patient left: in chair;with call bell/phone within reach;with family/visitor present Nurse Communication: Mobility status;Other (comment) (pt readiness for same day d/c from PT standpoint) PT Visit Diagnosis: Unsteadiness on feet (R26.81);Other abnormalities of gait and mobility (R26.89);Muscle weakness (generalized) (M62.81);Difficulty in walking, not elsewhere classified (R26.2);Pain Pain - Right/Left: Right Pain - part of body: Knee;Leg    Time: 8460-8366 PT Time Calculation (min) (ACUTE ONLY): 54  min   Charges:   PT Evaluation $PT Eval Low Complexity: 1 Low PT Treatments $Gait Training: 8-22 mins $Therapeutic Exercise: 8-22 mins $Therapeutic Activity: 8-22 mins PT General Charges $$ ACUTE PT VISIT: 1 Visit         [place], PT Acute Rehab   [place] VEAR Drone 07/06/2024, 5:16 PM "

## 2024-07-06 NOTE — Anesthesia Procedure Notes (Signed)
 Anesthesia Regional Block: Adductor canal block   Pre-Anesthetic Checklist: , timeout performed,  Correct Patient, Correct Site, Correct Laterality,  Correct Procedure, Correct Position, site marked,  Risks and benefits discussed,  Surgical consent,  Pre-op evaluation,  At surgeon's request and post-op pain management  Laterality: Right  Prep: chloraprep       Needles:  Injection technique: Single-shot  Needle Type: Echogenic Needle     Needle Length: 9cm  Needle Gauge: 21     Additional Needles:   Narrative:  Start time: 07/06/2024 8:12 AM End time: 07/06/2024 8:20 AM Injection made incrementally with aspirations every 5 mL.  Performed by: Personally  Anesthesiologist: Maryclare Cornet, MD  Additional Notes: Pt tolerated the procedure well.

## 2024-07-07 ENCOUNTER — Encounter (HOSPITAL_COMMUNITY): Payer: Self-pay | Admitting: Orthopedic Surgery
